# Patient Record
Sex: Male | Born: 1965 | Race: White | Hispanic: No | Marital: Married | Smoking: Never smoker
Health system: Southern US, Community
[De-identification: ages and names within clinical notes are randomized; demographics above are authoritative.]

---

## 2000-01-21 ENCOUNTER — Emergency Department (HOSPITAL_COMMUNITY): Admission: EM | Admit: 2000-01-21 | Discharge: 2000-01-21 | Payer: Self-pay | Admitting: Emergency Medicine

## 2012-05-11 ENCOUNTER — Ambulatory Visit: Payer: Self-pay | Admitting: Sports Medicine

## 2015-11-21 DIAGNOSIS — R21 Rash and other nonspecific skin eruption: Secondary | ICD-10-CM | POA: Diagnosis not present

## 2016-04-10 DIAGNOSIS — R140 Abdominal distension (gaseous): Secondary | ICD-10-CM | POA: Diagnosis not present

## 2016-04-10 DIAGNOSIS — K581 Irritable bowel syndrome with constipation: Secondary | ICD-10-CM | POA: Diagnosis not present

## 2016-04-10 DIAGNOSIS — E738 Other lactose intolerance: Secondary | ICD-10-CM | POA: Diagnosis not present

## 2016-04-10 DIAGNOSIS — R1032 Left lower quadrant pain: Secondary | ICD-10-CM | POA: Diagnosis not present

## 2016-05-02 DIAGNOSIS — F4322 Adjustment disorder with anxiety: Secondary | ICD-10-CM | POA: Diagnosis not present

## 2016-05-15 DIAGNOSIS — F4322 Adjustment disorder with anxiety: Secondary | ICD-10-CM | POA: Diagnosis not present

## 2016-05-27 DIAGNOSIS — M25511 Pain in right shoulder: Secondary | ICD-10-CM | POA: Diagnosis not present

## 2016-05-30 DIAGNOSIS — F4322 Adjustment disorder with anxiety: Secondary | ICD-10-CM | POA: Diagnosis not present

## 2016-07-21 DIAGNOSIS — F4322 Adjustment disorder with anxiety: Secondary | ICD-10-CM | POA: Diagnosis not present

## 2016-07-25 ENCOUNTER — Encounter: Payer: Self-pay | Admitting: Family Medicine

## 2016-07-25 ENCOUNTER — Ambulatory Visit (INDEPENDENT_AMBULATORY_CARE_PROVIDER_SITE_OTHER): Payer: BLUE CROSS/BLUE SHIELD | Admitting: Family Medicine

## 2016-07-25 VITALS — BP 110/80 | HR 91 | Temp 97.9°F | Ht 70.25 in | Wt 187.0 lb

## 2016-07-25 DIAGNOSIS — Z0000 Encounter for general adult medical examination without abnormal findings: Secondary | ICD-10-CM | POA: Diagnosis not present

## 2016-07-25 DIAGNOSIS — K582 Mixed irritable bowel syndrome: Secondary | ICD-10-CM

## 2016-07-25 DIAGNOSIS — Z23 Encounter for immunization: Secondary | ICD-10-CM

## 2016-07-25 LAB — CBC WITH DIFFERENTIAL/PLATELET
BASOS ABS: 0 10*3/uL (ref 0.0–0.1)
Basophils Relative: 0.5 % (ref 0.0–3.0)
Eosinophils Absolute: 0.3 10*3/uL (ref 0.0–0.7)
Eosinophils Relative: 5.4 % — ABNORMAL HIGH (ref 0.0–5.0)
HEMATOCRIT: 42.2 % (ref 39.0–52.0)
HEMOGLOBIN: 14.6 g/dL (ref 13.0–17.0)
LYMPHS PCT: 29.4 % (ref 12.0–46.0)
Lymphs Abs: 1.8 10*3/uL (ref 0.7–4.0)
MCHC: 34.7 g/dL (ref 30.0–36.0)
MCV: 86.7 fl (ref 78.0–100.0)
Monocytes Absolute: 0.8 10*3/uL (ref 0.1–1.0)
Monocytes Relative: 12.9 % — ABNORMAL HIGH (ref 3.0–12.0)
Neutro Abs: 3.2 10*3/uL (ref 1.4–7.7)
Neutrophils Relative %: 51.8 % (ref 43.0–77.0)
Platelets: 278 10*3/uL (ref 150.0–400.0)
RBC: 4.86 Mil/uL (ref 4.22–5.81)
RDW: 12.3 % (ref 11.5–15.5)
WBC: 6.2 10*3/uL (ref 4.0–10.5)

## 2016-07-25 LAB — HEPATIC FUNCTION PANEL
ALT: 48 U/L (ref 0–53)
AST: 46 U/L — AB (ref 0–37)
Albumin: 4.3 g/dL (ref 3.5–5.2)
Alkaline Phosphatase: 53 U/L (ref 39–117)
BILIRUBIN DIRECT: 0.1 mg/dL (ref 0.0–0.3)
Total Bilirubin: 0.5 mg/dL (ref 0.2–1.2)
Total Protein: 7 g/dL (ref 6.0–8.3)

## 2016-07-25 LAB — LIPID PANEL
CHOL/HDL RATIO: 3
Cholesterol: 205 mg/dL — ABNORMAL HIGH (ref 0–200)
HDL: 76.4 mg/dL (ref >39.00)
LDL CALC: 113 mg/dL — AB (ref 0–99)
NonHDL: 128.98
Triglycerides: 81 mg/dL (ref 0.0–149.0)
VLDL: 16.2 mg/dL (ref 0.0–40.0)

## 2016-07-25 LAB — BASIC METABOLIC PANEL
BUN: 20 mg/dL (ref 6–23)
CHLORIDE: 101 meq/L (ref 96–112)
CO2: 31 meq/L (ref 19–32)
CREATININE: 1.06 mg/dL (ref 0.40–1.50)
Calcium: 9.7 mg/dL (ref 8.4–10.5)
GFR: 79.01 mL/min (ref >60.00)
Glucose, Bld: 81 mg/dL (ref 70–99)
Potassium: 4.6 mEq/L (ref 3.5–5.1)
Sodium: 138 mEq/L (ref 135–145)

## 2016-07-25 LAB — TSH: TSH: 1.27 u[IU]/mL (ref 0.35–4.50)

## 2016-07-25 NOTE — Progress Notes (Signed)
Subjective:     Patient ID: Justin Rivera, male   DOB: 1966-02-01, 51 y.o.   MRN: [id number]  HPI Patient seen to establish care. Generally very healthy. He has not seen her regular physician in years. He works as a Education officer, community. He exercises fairly regularly. He takes no medications. He has question of IBS and has been followed by GI in the past. He states he had colonoscopy 2016 which was normal. He had problems with alternating constipation diarrhea and symptoms seem to be improved with peppermint oil.   He relates over the past several weeks having some intermittent episodes of anxiety type symptoms which tend to come on fairly abruptly and usually in the afternoon. These do not seem to occur in any one setting. He does not feel particularly stressed. He states that he frequently has a "flushed" sensation. He's never had any diaphoresis, headache, chest pain, dyspnea, palpitations, or any focal weakness. No recent appetite or weight changes.  No history of panic disorder. Symptoms sometimes last an hour but can last for several hours. No regular alcohol use. Nonsmoker.  He was taking DHEA supplement until a few weeks ago and he stopped that couple weeks ago and over the past 10 days he has not had any episodes of symptoms above  History reviewed. No pertinent past medical history. History reviewed. No pertinent surgical history.  reports that he has never smoked. He uses smokeless tobacco. He reports that he drinks alcohol. He reports that he does not use drugs. family history includes Heart attack in his maternal grandmother; Hypertension in his father. No Known Allergies   Review of Systems  Constitutional: Negative for activity change, appetite change, fatigue and fever.  HENT: Negative for congestion, ear pain and trouble swallowing.   Eyes: Negative for pain and visual disturbance.  Respiratory: Negative for cough, shortness of breath and wheezing.   Cardiovascular: Negative for chest  pain and palpitations.  Gastrointestinal: Negative for abdominal distention, abdominal pain, blood in stool, constipation, diarrhea, nausea, rectal pain and vomiting.  Endocrine: Negative for polydipsia and polyuria.  Genitourinary: Negative for dysuria, hematuria and testicular pain.  Musculoskeletal: Negative for arthralgias and joint swelling.  Skin: Negative for rash.  Neurological: Negative for dizziness, tremors, seizures, syncope, weakness and headaches.  Hematological: Negative for adenopathy.  Psychiatric/Behavioral: Negative for confusion and dysphoric mood.       Objective:   Physical Exam  Constitutional: He is oriented to person, place, and time. He appears well-developed and well-nourished. No distress.  HENT:  Head: Normocephalic and atraumatic.  Right Ear: External ear normal.  Left Ear: External ear normal.  Mouth/Throat: Oropharynx is clear and moist.  Eyes: Conjunctivae and EOM are normal. Pupils are equal, round, and reactive to light.  Neck: Normal range of motion. Neck supple. No thyromegaly present.  Cardiovascular: Normal rate, regular rhythm and normal heart sounds.   No murmur heard. Pulmonary/Chest: No respiratory distress. He has no wheezes. He has no rales.  Abdominal: Soft. Bowel sounds are normal. He exhibits no distension and no mass. There is no tenderness. There is no rebound and no guarding.  Musculoskeletal: He exhibits no edema.  Lymphadenopathy:    He has no cervical adenopathy.  Neurological: He is alert and oriented to person, place, and time. He displays normal reflexes. No cranial nerve deficit.  Skin: No rash noted.  Psychiatric: He has a normal mood and affect.       Assessment:     Physical exam. Patient has questionable history  of IBS as above relatively stable and improved with peppermint oil. He is describing recent episodes of what sounds anxiety type symptoms which are usually fairly transient. No history of panic disorder.     Plan:     -Check labs with CBC, TSH, basic metabolic panel, lipid panel, hepatic panel -Tetanus booster and flu vaccine given -Avoid supplements such as DHEA.  Justin CoveyBruce W Maeci Kalbfleisch MD [facility] at [facility]

## 2016-07-25 NOTE — Progress Notes (Signed)
Pre visit review using our clinic review tool, if applicable. No additional management support is needed unless otherwise documented below in the visit note. 

## 2016-07-27 DIAGNOSIS — K589 Irritable bowel syndrome without diarrhea: Secondary | ICD-10-CM | POA: Insufficient documentation

## 2016-08-01 ENCOUNTER — Telehealth: Payer: Self-pay | Admitting: Family Medicine

## 2016-08-01 ENCOUNTER — Other Ambulatory Visit: Payer: Self-pay

## 2016-08-01 DIAGNOSIS — R748 Abnormal levels of other serum enzymes: Secondary | ICD-10-CM

## 2016-08-01 NOTE — Telephone Encounter (Signed)
Pt is aware of results. 

## 2016-08-01 NOTE — Telephone Encounter (Signed)
Pt is return autumn call °

## 2016-08-07 DIAGNOSIS — F4322 Adjustment disorder with anxiety: Secondary | ICD-10-CM | POA: Diagnosis not present

## 2016-08-28 DIAGNOSIS — R140 Abdominal distension (gaseous): Secondary | ICD-10-CM | POA: Diagnosis not present

## 2016-08-28 DIAGNOSIS — K581 Irritable bowel syndrome with constipation: Secondary | ICD-10-CM | POA: Diagnosis not present

## 2016-09-05 DIAGNOSIS — F4322 Adjustment disorder with anxiety: Secondary | ICD-10-CM | POA: Diagnosis not present

## 2016-09-25 DIAGNOSIS — F4322 Adjustment disorder with anxiety: Secondary | ICD-10-CM | POA: Diagnosis not present

## 2016-10-03 ENCOUNTER — Other Ambulatory Visit (INDEPENDENT_AMBULATORY_CARE_PROVIDER_SITE_OTHER): Payer: BLUE CROSS/BLUE SHIELD

## 2016-10-03 DIAGNOSIS — R748 Abnormal levels of other serum enzymes: Secondary | ICD-10-CM

## 2016-10-03 LAB — HEPATIC FUNCTION PANEL
ALBUMIN: 4.2 g/dL (ref 3.5–5.2)
ALK PHOS: 57 U/L (ref 39–117)
ALT: 26 U/L (ref 0–53)
AST: 32 U/L (ref 0–37)
Bilirubin, Direct: 0.1 mg/dL (ref 0.0–0.3)
TOTAL PROTEIN: 6.9 g/dL (ref 6.0–8.3)
Total Bilirubin: 0.5 mg/dL (ref 0.2–1.2)

## 2016-10-08 DIAGNOSIS — R0982 Postnasal drip: Secondary | ICD-10-CM | POA: Diagnosis not present

## 2016-10-08 DIAGNOSIS — R05 Cough: Secondary | ICD-10-CM | POA: Diagnosis not present

## 2016-10-08 DIAGNOSIS — B9789 Other viral agents as the cause of diseases classified elsewhere: Secondary | ICD-10-CM | POA: Diagnosis not present

## 2016-10-08 DIAGNOSIS — J988 Other specified respiratory disorders: Secondary | ICD-10-CM | POA: Diagnosis not present

## 2016-11-07 ENCOUNTER — Ambulatory Visit (INDEPENDENT_AMBULATORY_CARE_PROVIDER_SITE_OTHER): Payer: BLUE CROSS/BLUE SHIELD | Admitting: Family Medicine

## 2016-11-07 ENCOUNTER — Encounter: Payer: Self-pay | Admitting: Family Medicine

## 2016-11-07 VITALS — BP 120/70 | HR 64 | Temp 98.5°F | Wt 192.6 lb

## 2016-11-07 DIAGNOSIS — F4322 Adjustment disorder with anxiety: Secondary | ICD-10-CM | POA: Diagnosis not present

## 2016-11-07 DIAGNOSIS — R053 Chronic cough: Secondary | ICD-10-CM

## 2016-11-07 DIAGNOSIS — R05 Cough: Secondary | ICD-10-CM

## 2016-11-07 NOTE — Progress Notes (Signed)
Subjective:     Patient ID: Justin Rivera, male   DOB: [birth year], 51 y.o.   MRN: [id number]  HPI Patient seen with 3 month history of intermittent cough. Never smoked. Cough has been very intermittent and nonproductive for the most part. Denies any appetite changes, weight changes, night sweats, fever, or dyspnea. Not aware of any postnasal drip symptoms. Occasional GERD symptoms. He has noted that cough seems to be worse in cold weather. No pleuritic pain.  4 weeks ago he went to another clinic and was told they thought he had viral type process. He was prescribed some type of cough suppressant but never took it.  He has used smokeless tobacco and is actually in the middle of a course of Chantix. He has noted interestingly that his anxiety symptoms seem to be much improved with taking Chantix and also his irritable bowel symptoms also seem to be improved. He has questions about whether he may be a candidate to take Wellbutrin when he finishes the Chantix.  No depression symptoms.  No past medical history on file. No past surgical history on file.  reports that he has never smoked. He uses smokeless tobacco. He reports that he drinks alcohol. He reports that he does not use drugs. family history includes Heart attack in his maternal grandmother; Hypertension in his father. No Known Allergies   Review of Systems  Constitutional: Negative for appetite change, chills, fever and unexpected weight change.  HENT: Negative for congestion, postnasal drip, sinus pressure and sore throat.   Respiratory: Positive for cough. Negative for shortness of breath and wheezing.   Cardiovascular: Negative for chest pain, palpitations and leg swelling.       Objective:   Physical Exam  Constitutional: He appears well-developed and well-nourished.  HENT:  Right Ear: External ear normal.  Left Ear: External ear normal.  Mouth/Throat: Oropharynx is clear and moist. No oropharyngeal exudate.  Neck: Neck  supple.  Cardiovascular: Normal rate and regular rhythm.   Pulmonary/Chest: Effort normal and breath sounds normal. No respiratory distress. He has no wheezes. He has no rales.  Lymphadenopathy:    He has no cervical adenopathy.       Assessment:     #1 several week history of intermittent cough. Nonfocal exam. No evidence for reactive airway changes. No postnasal drip. Question silent GERD related  #2 history of smokeless tobacco use currently on Chantix    Plan:     -Recommend trial of over-the-counter PPI with Nexium, Prevacid, or prilosec -Consider over-the-counter chlorpheniramine at night though he denies any current postnasal drip symptoms. -Chest x-ray if cough not improving over the next few weeks  Kristian Covey MD [facility] at [facility]

## 2016-11-07 NOTE — Patient Instructions (Signed)
Try OTC Prevacid, Nexium, or Prilosec.   Let me know if cough not improving over the next few weeks.

## 2016-11-07 NOTE — Progress Notes (Signed)
Pre visit review using our clinic review tool, if applicable. No additional management support is needed unless otherwise documented below in the visit note. 

## 2016-11-19 DIAGNOSIS — F4322 Adjustment disorder with anxiety: Secondary | ICD-10-CM | POA: Diagnosis not present

## 2016-12-12 DIAGNOSIS — F4322 Adjustment disorder with anxiety: Secondary | ICD-10-CM | POA: Diagnosis not present

## 2017-02-13 DIAGNOSIS — F4322 Adjustment disorder with anxiety: Secondary | ICD-10-CM | POA: Diagnosis not present

## 2017-03-06 DIAGNOSIS — F4322 Adjustment disorder with anxiety: Secondary | ICD-10-CM | POA: Diagnosis not present

## 2017-03-27 DIAGNOSIS — F4322 Adjustment disorder with anxiety: Secondary | ICD-10-CM | POA: Diagnosis not present

## 2017-04-10 DIAGNOSIS — M25551 Pain in right hip: Secondary | ICD-10-CM | POA: Diagnosis not present

## 2017-04-10 DIAGNOSIS — M25552 Pain in left hip: Secondary | ICD-10-CM | POA: Diagnosis not present

## 2017-05-01 ENCOUNTER — Ambulatory Visit (INDEPENDENT_AMBULATORY_CARE_PROVIDER_SITE_OTHER): Payer: BLUE CROSS/BLUE SHIELD | Admitting: Family Medicine

## 2017-05-01 ENCOUNTER — Encounter: Payer: Self-pay | Admitting: Family Medicine

## 2017-05-01 VITALS — BP 110/80 | HR 100 | Temp 98.3°F | Wt 189.3 lb

## 2017-05-01 DIAGNOSIS — Z23 Encounter for immunization: Secondary | ICD-10-CM

## 2017-05-01 DIAGNOSIS — F4322 Adjustment disorder with anxiety: Secondary | ICD-10-CM | POA: Diagnosis not present

## 2017-05-01 DIAGNOSIS — F419 Anxiety disorder, unspecified: Secondary | ICD-10-CM

## 2017-05-01 DIAGNOSIS — R0789 Other chest pain: Secondary | ICD-10-CM

## 2017-05-01 MED ORDER — SERTRALINE HCL 50 MG PO TABS: 50.0000 mg | ORAL_TABLET | Freq: Every day | ORAL | 3 refills | Status: DC

## 2017-05-01 NOTE — Progress Notes (Signed)
Subjective:     Patient ID: Justin Rivera, male   DOB: [birth year], 51 y.o.   MRN: [id number]  HPI Patient here to discuss anxiety issues. He states for the past several months he's had about every 3-4 weeks episodes of anxiety which usually last 3-4 days. These are fairly intense and last throughout the day. He's not had any issues with insomnia and generally sleeps well at night. Fairly pervasive anxiety symptoms but denies any history of depression.   Sometimes has brief atypical chest symptoms of pain- but never exertional.  He has used oral nicotine and had self prescribed Chantix which seemed to reduce his anxiety symptoms somewhat. Denies regular alcohol use. Stable caffeine use. No depression symptoms Pt is concerned whether there may be some sort of "hormonal" imbalance given the episodic nature.   Patient is concerned because brother died couple years ago suddenly age 49. There was no autopsy done.  They were unsure of cause of death. They had considered whether this could've been acute MI.   Is not aware of any family history of first degree relative with aneurysm.  Pt is able to exercise fairly vigorously without chest pain but is concerned because of brother's sudden death as above.    No past medical history on file. No past surgical history on file.  reports that he has never smoked. He uses smokeless tobacco. He reports that he drinks alcohol. He reports that he does not use drugs. family history includes Heart attack in his maternal grandmother; Hypertension in his father. No Known Allergies   Review of Systems  Constitutional: Negative for appetite change, chills, fever and unexpected weight change.  Respiratory: Negative for cough and shortness of breath.   Cardiovascular: Negative for leg swelling.       Objective:   Physical Exam  Constitutional: He is oriented to person, place, and time. He appears well-developed and well-nourished.  HENT:  Right Ear: External ear  normal.  Left Ear: External ear normal.  Mouth/Throat: Oropharynx is clear and moist.  Eyes: Pupils are equal, round, and reactive to light.  Neck: Neck supple. No thyromegaly present.  Cardiovascular: Normal rate and regular rhythm.   Pulmonary/Chest: Effort normal and breath sounds normal. No respiratory distress. He has no wheezes. He has no rales.  Musculoskeletal: He exhibits no edema.  Neurological: He is alert and oriented to person, place, and time.       Assessment:     #1 intermittent anxiety symptoms which usually last 3-4 days duration. These do not sound typical for panic disorder, generalized anxiety, or other specific anxiety.  #2 positive family history of sudden death in first-degree relative-brother    Plan:     -We recommended trial sertraline 50 mg once daily and feedback in 3 weeks.  Reviewed potential side effects.   -we discussed possible further testing to delineate cardiac risk of sudden death.  Discussed pros and cons of various tests- regular treadmill testing, stress echo, nuclear stress test, CT morphology study and he is interested in the latter.   -pt inquiring about testing "hormones" during episode.  Will check TSH, cortisol, DHEA, and testosterone during flare.  Kristian CoveyBruce W Samiah Ricklefs MD [facility]

## 2017-05-22 DIAGNOSIS — F4322 Adjustment disorder with anxiety: Secondary | ICD-10-CM | POA: Diagnosis not present

## 2017-06-12 ENCOUNTER — Ambulatory Visit: Payer: BLUE CROSS/BLUE SHIELD | Admitting: Family Medicine

## 2017-06-12 ENCOUNTER — Encounter: Payer: Self-pay | Admitting: Family Medicine

## 2017-06-12 VITALS — BP 120/80 | HR 65 | Temp 98.1°F | Wt 192.7 lb

## 2017-06-12 DIAGNOSIS — F419 Anxiety disorder, unspecified: Secondary | ICD-10-CM

## 2017-06-12 DIAGNOSIS — R229 Localized swelling, mass and lump, unspecified: Secondary | ICD-10-CM | POA: Diagnosis not present

## 2017-06-12 NOTE — Progress Notes (Signed)
Subjective:     Patient ID: Justin Rivera, male   DOB: 1966/04/02, 51 y.o.   MRN: [id number]  HPI Patient seen for follow-up of recent anxiety issues. Refer to recent notes. We started sertraline 50 mg daily. He has tolerated well no side effects. Anxiety symptoms seem to be suppressed. He recently reduced this himself to 25 mg but then went back up to 50 mg.  He relates subcutaneous thickening right lateral hip region. He noticed this incidentally recently. Nonpainful. He thinks this has grown some over the past couple of months .  Recently had gotten a massage and massage therapist had noticed this area. He has not noted any inguinal adenopathy. No night sweats. No fevers or chills. No appetite or weight changes.  No past medical history on file. No past surgical history on file.  reports that  has never smoked. He uses smokeless tobacco. He reports that he drinks alcohol. He reports that he does not use drugs. family history includes Heart attack in his maternal grandmother; Hypertension in his father. No Known Allergies   Review of Systems  Constitutional: Negative for appetite change, chills, fever and unexpected weight change.  Musculoskeletal: Negative for back pain.  Skin: Negative for rash.  Hematological: Negative for adenopathy. Does not bruise/bleed easily.  Psychiatric/Behavioral: Negative for dysphoric mood.       Objective:   Physical Exam  Constitutional: He appears well-developed and well-nourished.  Cardiovascular: Normal rate and regular rhythm.  Pulmonary/Chest: Effort normal and breath sounds normal. No respiratory distress. He has no wheezes. He has no rales.  Skin:  Patient has somewhat rounded fairly well demarcated mobile subcutaneous ?cystic type mass right lateral hip region. Nontender. No overlying skin changes.       Assessment:     #1 anxiety improved on sertraline  #2 right lateral hip subcutaneous swelling/mass. Benign features with increased  mobility and fairly well demarcated.    Plan:     -Continue sertraline 50 mg once daily -We discussed further evaluation of right lateral hip subcutaneous mass with either imaging or referral possibly to general surgery for further evaluation and he prefers the latter. We explained that imaging may still be equivocal  Kristian CoveyBruce W Daurice Ovando MD [facility]

## 2017-06-18 DIAGNOSIS — F4322 Adjustment disorder with anxiety: Secondary | ICD-10-CM | POA: Diagnosis not present

## 2017-07-02 DIAGNOSIS — F4322 Adjustment disorder with anxiety: Secondary | ICD-10-CM | POA: Diagnosis not present

## 2017-07-16 ENCOUNTER — Ambulatory Visit: Payer: Self-pay | Admitting: Surgery

## 2017-07-16 DIAGNOSIS — L723 Sebaceous cyst: Secondary | ICD-10-CM | POA: Diagnosis not present

## 2017-07-24 DIAGNOSIS — F4322 Adjustment disorder with anxiety: Secondary | ICD-10-CM | POA: Diagnosis not present

## 2017-08-06 DIAGNOSIS — F4322 Adjustment disorder with anxiety: Secondary | ICD-10-CM | POA: Diagnosis not present

## 2017-08-28 DIAGNOSIS — F4322 Adjustment disorder with anxiety: Secondary | ICD-10-CM | POA: Diagnosis not present

## 2017-10-02 DIAGNOSIS — F4322 Adjustment disorder with anxiety: Secondary | ICD-10-CM | POA: Diagnosis not present

## 2017-11-13 DIAGNOSIS — F4322 Adjustment disorder with anxiety: Secondary | ICD-10-CM | POA: Diagnosis not present

## 2017-11-26 DIAGNOSIS — F4322 Adjustment disorder with anxiety: Secondary | ICD-10-CM | POA: Diagnosis not present

## 2017-12-04 DIAGNOSIS — L218 Other seborrheic dermatitis: Secondary | ICD-10-CM | POA: Diagnosis not present

## 2017-12-04 DIAGNOSIS — L718 Other rosacea: Secondary | ICD-10-CM | POA: Diagnosis not present

## 2017-12-18 ENCOUNTER — Other Ambulatory Visit: Payer: Self-pay | Admitting: Family Medicine

## 2017-12-25 DIAGNOSIS — F4322 Adjustment disorder with anxiety: Secondary | ICD-10-CM | POA: Diagnosis not present

## 2017-12-29 ENCOUNTER — Ambulatory Visit (INDEPENDENT_AMBULATORY_CARE_PROVIDER_SITE_OTHER): Payer: BLUE CROSS/BLUE SHIELD | Admitting: Family Medicine

## 2017-12-29 VITALS — BP 122/80 | HR 64 | Temp 97.7°F | Wt 193.7 lb

## 2017-12-29 DIAGNOSIS — K582 Mixed irritable bowel syndrome: Secondary | ICD-10-CM | POA: Diagnosis not present

## 2017-12-29 DIAGNOSIS — F419 Anxiety disorder, unspecified: Secondary | ICD-10-CM | POA: Diagnosis not present

## 2017-12-29 DIAGNOSIS — R0789 Other chest pain: Secondary | ICD-10-CM | POA: Diagnosis not present

## 2017-12-29 NOTE — Progress Notes (Signed)
  Subjective:     Patient ID: Justin Rivera, male   DOB: September 10, 1965, 52 y.o.   MRN: [id number]  HPI Patient seen discuss following issues  Long history of GI issues. He's been diagnosed with IBS per GI in the past. He has episodes of abdominal bloating associated with nausea without vomiting along with intermittent constipation and diarrhea. He states that he tends to "cycle". Frequently has couple weeks of symptom free interval alternationg with about a week of increased symptoms. He's tried multiple things including probiotics and Xifaxan without improvement.  He's had previous colonoscopy about 4 years ago per GI. He's taken  TUMS without relief. Peppermint oil provided some relief.  His tried elimination of dairy and gluten's without improvement.  He thinks GI symptoms trigger increased anxiety. He has what he describes as "panic "attack. When he feels very bloated he has increased anxiety and occasional associated chest pain. Has never had any exertional chest pain. He was tried on Zoloft but at 50 mg daily did not feel like this helped his anxiety.  Patient exercises regularly and has never had chest pain with exertion. He did have a brother that died of sudden cardiac death around age 50. We had discussed cardiac CT calcium scoring about a year ago and referral was made but for some reason he does not recall being notified and  never went  No past medical history on file. No past surgical history on file.  reports that he has never smoked. He uses smokeless tobacco. He reports that he drinks alcohol. He reports that he does not use drugs. family history includes Heart attack in his maternal grandmother; Hypertension in his father. No Known Allergies   Review of Systems  Constitutional: Negative for fatigue.  Eyes: Negative for visual disturbance.  Respiratory: Negative for cough and chest tightness.   Cardiovascular: Negative for palpitations and leg swelling.  Gastrointestinal:  Positive for abdominal distention, constipation, diarrhea and nausea. Negative for blood in stool and vomiting.  Neurological: Negative for dizziness, syncope, weakness, light-headedness and headaches.       Objective:   Physical Exam  Constitutional: He appears well-developed and well-nourished.  Cardiovascular: Normal rate and regular rhythm.  No murmur heard. Pulmonary/Chest: Effort normal and breath sounds normal. No respiratory distress. He has no wheezes. He has no rales.  Musculoskeletal: He exhibits no edema.       Assessment:     #1 frequent GI complaints with bloating and nausea without vomiting. Reported history of IBS  #2 anxiety symptoms which he correlates consistently with GI symptoms  #3 intermittent atypical chest pain. Never exertional related. Positive family history of premature CAD in a brother    Plan:     -We recommend he get back into see GI and he will set this up -We discussed other potential options for treatment of anxiety. Recommended against benzodiazepines. We discussed trial of another SSRI at this point he wishes to wait and observe -Set up CT cardiac calcium scoring to help further risk stratify  Kristian CoveyBruce W Rushawn Capshaw MD [facility]

## 2017-12-29 NOTE — Patient Instructions (Signed)
We will set up Cardiac CT scoring.   I think it would be a good to set up to see Dr Loreta AveMann again.

## 2018-01-07 DIAGNOSIS — K581 Irritable bowel syndrome with constipation: Secondary | ICD-10-CM | POA: Diagnosis not present

## 2018-01-07 DIAGNOSIS — R140 Abdominal distension (gaseous): Secondary | ICD-10-CM | POA: Diagnosis not present

## 2018-01-13 ENCOUNTER — Other Ambulatory Visit: Payer: BLUE CROSS/BLUE SHIELD

## 2018-02-05 ENCOUNTER — Ambulatory Visit (INDEPENDENT_AMBULATORY_CARE_PROVIDER_SITE_OTHER)
Admission: RE | Admit: 2018-02-05 | Discharge: 2018-02-05 | Disposition: A | Discharge status: Home / Self Care | Payer: Self-pay | Source: Ambulatory Visit | Attending: Family Medicine | Admitting: Family Medicine

## 2018-02-05 ENCOUNTER — Ambulatory Visit: Payer: Self-pay | Admitting: *Deleted

## 2018-02-05 DIAGNOSIS — R0789 Other chest pain: Secondary | ICD-10-CM

## 2018-02-05 NOTE — Telephone Encounter (Signed)
'  [facility] report from CT Cardiac Scoring:  "RIGHT lower lobe pulmonary nodule. No follow-up needed if patient is low-risk. Non-contrast chest CT can be considered in 12 months if patient is high-risk."

## 2018-03-26 DIAGNOSIS — L218 Other seborrheic dermatitis: Secondary | ICD-10-CM | POA: Diagnosis not present

## 2018-03-26 DIAGNOSIS — L245 Irritant contact dermatitis due to other chemical products: Secondary | ICD-10-CM | POA: Diagnosis not present

## 2018-03-26 DIAGNOSIS — L718 Other rosacea: Secondary | ICD-10-CM | POA: Diagnosis not present

## 2018-05-07 DIAGNOSIS — L718 Other rosacea: Secondary | ICD-10-CM | POA: Diagnosis not present

## 2018-05-07 DIAGNOSIS — L218 Other seborrheic dermatitis: Secondary | ICD-10-CM | POA: Diagnosis not present

## 2018-08-12 DIAGNOSIS — F419 Anxiety disorder, unspecified: Secondary | ICD-10-CM | POA: Diagnosis not present

## 2018-09-07 DIAGNOSIS — R194 Change in bowel habit: Secondary | ICD-10-CM | POA: Diagnosis not present

## 2018-09-07 DIAGNOSIS — Z1211 Encounter for screening for malignant neoplasm of colon: Secondary | ICD-10-CM | POA: Diagnosis not present

## 2018-11-04 ENCOUNTER — Ambulatory Visit (INDEPENDENT_AMBULATORY_CARE_PROVIDER_SITE_OTHER): Payer: BLUE CROSS/BLUE SHIELD | Admitting: Family Medicine

## 2018-11-04 ENCOUNTER — Other Ambulatory Visit: Payer: Self-pay

## 2018-11-04 DIAGNOSIS — R05 Cough: Secondary | ICD-10-CM | POA: Diagnosis not present

## 2018-11-04 DIAGNOSIS — R059 Cough, unspecified: Secondary | ICD-10-CM

## 2018-11-04 NOTE — Progress Notes (Signed)
Patient ID: Justin Rivera, male   DOB: 1966/07/02, 53 y.o.   MRN: [id number]  Virtual Visit via Video Note  This visit type was conducted due to national recommendations for restrictions regarding the COVID-19 pandemic in an effort to limit this patient's exposure and mitigate transmission in our community.    I connected with Abdulkadir Mcaulay on 11/04/18 at  8:45 AM EDT by a video enabled telemedicine application and verified that I am speaking with the correct person using two identifiers.  Location patient: home Location provider:work or home office Persons participating in the virtual visit: patient, provider  I discussed the limitations of evaluation and management by telemedicine and the availability of in person appointments. The patient expressed understanding and agreed to proceed.   HPI: Patient has had about 2 months of dry cough.  He states back in February around the middle of the month he went to a continuing education conference in [place], [place].  He developed fever for 2 to 3 days and had some increased cough.  His fever resolved after a few days.  He generally feels well now with no appetite or weight changes.  Now has just some dry cough which is relatively mild.  No chills.  No dyspnea.  Exercising without difficulty.  Never smoked.  Denies any GERD symptoms.  No postnasal drip symptoms.  No wheezing.  His main concern is that he works as an Transport planner and would like to confirm his COVID-19 status prior to going back.    He is not concerned about infectious risk at this time but mostly his immune status before going back into frontline's medical therapy.   ROS: See pertinent positives and negatives per HPI.  No past medical history on file.  No past surgical history on file.  Family History  Problem Relation Age of Onset  . Hypertension Father   . Heart attack Maternal Grandmother     SOCIAL HX: Non-smoker.   Current Outpatient Medications:  .  sertraline  (ZOLOFT) 50 MG tablet, TAKE 1 TABLET BY MOUTH EVERY DAY (Patient not taking: Reported on 12/29/2017), Disp: 90 tablet, Rfl: 0  EXAM:  VITALS per patient if applicable:  GENERAL: alert, oriented, appears well and in no acute distress  HEENT: atraumatic, conjunttiva clear, no obvious abnormalities on inspection of external nose and ears  NECK: normal movements of the head and neck  LUNGS: on inspection no signs of respiratory distress, breathing rate appears normal, no obvious gross SOB, gasping or wheezing  CV: no obvious cyanosis  MS: moves all visible extremities without noticeable abnormality  PSYCH/NEURO: pleasant and cooperative, no obvious depression or anxiety, speech and thought processing grossly intact  ASSESSMENT AND PLAN:  Discussed the following assessment and plan:  Cough.  Suspect related to recent acute viral process.  Patient did have some initial fever.  Even though this was back in February he has some concerns about previous COVID-19 infection.  He specifically would like to confirm if he had infection.  He knows he is not likely infective at this point as onset was almost 2 months ago but he would like to know his status before going back onto the front lines of healthcare.  This seems reasonable.  -We ordered COVID 19 IgG. -We discussed etiologies of persistent cough including silent GERD, postnasal drip, reactive airway, medication related such as ACE inhibitor.  He does not have any red flags such as appetite or weight change, fever, dyspnea.  No ACE use.  No GERD  or PND symptoms. -If cough not fully resolving in the next couple weeks consider chest x-ray and further evaluation     I discussed the assessment and treatment plan with the patient. The patient was provided an opportunity to ask questions and all were answered. The patient agreed with the plan and demonstrated an understanding of the instructions.   The patient was advised to call back or seek an  in-person evaluation if the symptoms worsen or if the condition fails to improve as anticipated.   Evelena PeatBruce Genoa Freyre, MD

## 2018-11-05 LAB — SAR COV2 SEROLOGY (COVID19)AB(IGG),IA: SARS CoV2 AB IGG: NEGATIVE

## 2018-11-10 ENCOUNTER — Other Ambulatory Visit: Payer: Self-pay | Admitting: Family Medicine

## 2018-11-19 DIAGNOSIS — M25562 Pain in left knee: Secondary | ICD-10-CM | POA: Diagnosis not present

## 2018-11-26 DIAGNOSIS — M25562 Pain in left knee: Secondary | ICD-10-CM | POA: Diagnosis not present

## 2018-12-03 DIAGNOSIS — M25562 Pain in left knee: Secondary | ICD-10-CM | POA: Diagnosis not present

## 2018-12-03 DIAGNOSIS — M25561 Pain in right knee: Secondary | ICD-10-CM | POA: Diagnosis not present

## 2019-02-04 DIAGNOSIS — M19041 Primary osteoarthritis, right hand: Secondary | ICD-10-CM | POA: Diagnosis not present

## 2019-02-04 DIAGNOSIS — M79641 Pain in right hand: Secondary | ICD-10-CM | POA: Diagnosis not present

## 2019-04-22 DIAGNOSIS — M25512 Pain in left shoulder: Secondary | ICD-10-CM | POA: Diagnosis not present

## 2019-04-22 DIAGNOSIS — M79644 Pain in right finger(s): Secondary | ICD-10-CM | POA: Diagnosis not present

## 2019-04-22 DIAGNOSIS — M25511 Pain in right shoulder: Secondary | ICD-10-CM | POA: Diagnosis not present

## 2019-04-29 DIAGNOSIS — M25511 Pain in right shoulder: Secondary | ICD-10-CM | POA: Diagnosis not present

## 2019-05-03 DIAGNOSIS — Z20828 Contact with and (suspected) exposure to other viral communicable diseases: Secondary | ICD-10-CM | POA: Diagnosis not present

## 2019-06-12 DIAGNOSIS — Z20828 Contact with and (suspected) exposure to other viral communicable diseases: Secondary | ICD-10-CM | POA: Diagnosis not present

## 2019-06-28 DIAGNOSIS — M25561 Pain in right knee: Secondary | ICD-10-CM | POA: Diagnosis not present

## 2019-07-20 DIAGNOSIS — L0291 Cutaneous abscess, unspecified: Secondary | ICD-10-CM | POA: Diagnosis not present

## 2019-08-10 DIAGNOSIS — M9905 Segmental and somatic dysfunction of pelvic region: Secondary | ICD-10-CM | POA: Diagnosis not present

## 2019-08-10 DIAGNOSIS — M9903 Segmental and somatic dysfunction of lumbar region: Secondary | ICD-10-CM | POA: Diagnosis not present

## 2019-08-10 DIAGNOSIS — M9901 Segmental and somatic dysfunction of cervical region: Secondary | ICD-10-CM | POA: Diagnosis not present

## 2019-08-10 DIAGNOSIS — M9902 Segmental and somatic dysfunction of thoracic region: Secondary | ICD-10-CM | POA: Diagnosis not present

## 2019-08-16 DIAGNOSIS — M9903 Segmental and somatic dysfunction of lumbar region: Secondary | ICD-10-CM | POA: Diagnosis not present

## 2019-08-16 DIAGNOSIS — M9905 Segmental and somatic dysfunction of pelvic region: Secondary | ICD-10-CM | POA: Diagnosis not present

## 2019-08-16 DIAGNOSIS — M9901 Segmental and somatic dysfunction of cervical region: Secondary | ICD-10-CM | POA: Diagnosis not present

## 2019-08-16 DIAGNOSIS — M9902 Segmental and somatic dysfunction of thoracic region: Secondary | ICD-10-CM | POA: Diagnosis not present

## 2019-08-18 DIAGNOSIS — M9901 Segmental and somatic dysfunction of cervical region: Secondary | ICD-10-CM | POA: Diagnosis not present

## 2019-08-18 DIAGNOSIS — M9903 Segmental and somatic dysfunction of lumbar region: Secondary | ICD-10-CM | POA: Diagnosis not present

## 2019-08-18 DIAGNOSIS — M9905 Segmental and somatic dysfunction of pelvic region: Secondary | ICD-10-CM | POA: Diagnosis not present

## 2019-08-18 DIAGNOSIS — M9902 Segmental and somatic dysfunction of thoracic region: Secondary | ICD-10-CM | POA: Diagnosis not present

## 2019-10-27 DIAGNOSIS — M25562 Pain in left knee: Secondary | ICD-10-CM | POA: Diagnosis not present

## 2019-11-01 DIAGNOSIS — M9901 Segmental and somatic dysfunction of cervical region: Secondary | ICD-10-CM | POA: Diagnosis not present

## 2019-11-01 DIAGNOSIS — M9905 Segmental and somatic dysfunction of pelvic region: Secondary | ICD-10-CM | POA: Diagnosis not present

## 2019-11-01 DIAGNOSIS — M9902 Segmental and somatic dysfunction of thoracic region: Secondary | ICD-10-CM | POA: Diagnosis not present

## 2019-11-01 DIAGNOSIS — M9903 Segmental and somatic dysfunction of lumbar region: Secondary | ICD-10-CM | POA: Diagnosis not present

## 2019-11-14 ENCOUNTER — Telehealth (INDEPENDENT_AMBULATORY_CARE_PROVIDER_SITE_OTHER): Payer: BC Managed Care – PPO | Admitting: Family Medicine

## 2019-11-14 DIAGNOSIS — F410 Panic disorder [episodic paroxysmal anxiety] without agoraphobia: Secondary | ICD-10-CM | POA: Diagnosis not present

## 2019-11-14 NOTE — Progress Notes (Signed)
Patient ID: Justin Rivera, male   DOB: 09/07/1965, 54 y.o.   MRN: [id number]   This visit type was conducted due to national recommendations for restrictions regarding the COVID-19 pandemic in an effort to limit this patient's exposure and mitigate transmission in our community.   Virtual Visit via Video Note  I connected with Justin Rivera on 11/14/19 at  1:30 PM EDT by a video enabled telemedicine application and verified that I am speaking with the correct person using two identifiers.  Location patient: home Location provider:work or home office Persons participating in the virtual visit: patient, provider  I discussed the limitations of evaluation and management by telemedicine and the availability of in person appointments. The patient expressed understanding and agreed to proceed.   HPI: Justin Rivera has history of anxiety symptoms and we discussed these a few times in the past.  He was tried briefly on sertraline but he only took this about 30 days and cannot recall why stopped.  He does not recall any side effects.  He states either his anxiety symptoms had abated or the sertraline was not working.  He cannot recall which.  In any event, yesterday had severe anxiety symptoms.  He had some chest symptoms and shortness of breath.  He had never occurred with exertion.  He feels very anxious during episodes.  He still has some sertraline and actually started this back yesterday at 50 mg.  His wife had alprazolam a took a half of a 0.5 mg which seemed to help his anxiety symptoms  Denies any new or specific stressors.  Exercises regularly has never had any chest discomfort with exercise   ROS: See pertinent positives and negatives per HPI.  No past medical history on file.  No past surgical history on file.  Family History  Problem Relation Age of Onset  . Hypertension Father   . Heart attack Maternal Grandmother     SOCIAL HX: Married.  Works as an Associate Professor.  Building services engineer.   Current  Outpatient Medications:  .  sertraline (ZOLOFT) 50 MG tablet, TAKE 1 TABLET BY MOUTH EVERY DAY, Disp: 90 tablet, Rfl: 0  EXAM:  VITALS per patient if applicable:  GENERAL: alert, oriented, appears well and in no acute distress  HEENT: atraumatic, conjunttiva clear, no obvious abnormalities on inspection of external nose and ears  NECK: normal movements of the head and neck  LUNGS: on inspection no signs of respiratory distress, breathing rate appears normal, no obvious gross SOB, gasping or wheezing  CV: no obvious cyanosis  MS: moves all visible extremities without noticeable abnormality  PSYCH/NEURO: pleasant and cooperative, no obvious depression or anxiety, speech and thought processing grossly intact  ASSESSMENT AND PLAN:  Discussed the following assessment and plan:  Episodic anxiety.  Question panic disorder  -We discussed starting back sertraline at 25 mg daily for 1 week then titrate to 50 mg.  May further titrate up by 25 mg weekly up to 100 mg if needed for ongoing anxiety symptoms.  We will touch base again in 3 weeks to reassess     I discussed the assessment and treatment plan with the patient. The patient was provided an opportunity to ask questions and all were answered. The patient agreed with the plan and demonstrated an understanding of the instructions.   The patient was advised to call back or seek an in-person evaluation if the symptoms worsen or if the condition fails to improve as anticipated.     Evelena Peat, MD

## 2019-11-22 DIAGNOSIS — M9903 Segmental and somatic dysfunction of lumbar region: Secondary | ICD-10-CM | POA: Diagnosis not present

## 2019-11-22 DIAGNOSIS — M9901 Segmental and somatic dysfunction of cervical region: Secondary | ICD-10-CM | POA: Diagnosis not present

## 2019-11-22 DIAGNOSIS — M9902 Segmental and somatic dysfunction of thoracic region: Secondary | ICD-10-CM | POA: Diagnosis not present

## 2019-11-22 DIAGNOSIS — M9905 Segmental and somatic dysfunction of pelvic region: Secondary | ICD-10-CM | POA: Diagnosis not present

## 2020-01-19 ENCOUNTER — Telehealth: Payer: Self-pay | Admitting: Family Medicine

## 2020-01-19 MED ORDER — SERTRALINE HCL 50 MG PO TABS: 50.0000 mg | ORAL_TABLET | Freq: Every day | ORAL | 0 refills | Status: DC

## 2020-01-19 NOTE — Telephone Encounter (Signed)
Refill sent in

## 2020-01-19 NOTE — Telephone Encounter (Signed)
Pt stated the medication works very well. CPE set for 7/23 at 7:00 am.  Medication Refill:  Zoloft   Pharmacy:  [id number] - [place], [place] - [address] Phone:  [phone]  Fax:  [phone]

## 2020-02-03 ENCOUNTER — Ambulatory Visit (INDEPENDENT_AMBULATORY_CARE_PROVIDER_SITE_OTHER): Payer: BC Managed Care – PPO | Admitting: Family Medicine

## 2020-02-03 ENCOUNTER — Other Ambulatory Visit: Payer: Self-pay

## 2020-02-03 ENCOUNTER — Encounter: Payer: Self-pay | Admitting: Family Medicine

## 2020-02-03 VITALS — BP 120/70 | HR 75 | Temp 97.9°F | Ht 70.25 in | Wt 200.3 lb

## 2020-02-03 DIAGNOSIS — Z0000 Encounter for general adult medical examination without abnormal findings: Secondary | ICD-10-CM | POA: Diagnosis not present

## 2020-02-03 NOTE — Progress Notes (Signed)
Established Patient Office Visit  Subjective:  Patient ID: Justin Rivera, male    DOB: April 10, 1966  Age: 54 y.o. MRN: [id number]  CC:  Chief Complaint  Patient presents with  . Annual Exam    HPI Justin Rivera presents for physical exam.  Generally very healthy.  He has history of some chronic anxiety which has been stable on sertraline.  Takes no other medications.  He states he had colonoscopy sometime around 2018 and was told to get follow-up in a couple years.  He is in process of scheduling.  He is not aware of any prior history of polyps.  Questionable history of irritable bowel syndrome.  Other health maintenance reviewed  -No history of shingles vaccine -No history of hepatitis C screening and low risk -Tetanus up-to-date -Covid vaccine already given    History reviewed. No pertinent past medical history.  History reviewed. No pertinent surgical history.  Family History  Problem Relation Age of Onset  . Hypertension Father   . Heart attack Maternal Grandmother     Social History   Socioeconomic History  . Marital status: Married    Spouse name: Not on file  . Number of children: Not on file  . Years of education: Not on file  . Highest education level: Not on file  Occupational History  . Not on file  Tobacco Use  . Smoking status: Never Smoker  . Smokeless tobacco: Current User  Substance and Sexual Activity  . Alcohol use: Yes  . Drug use: No  . Sexual activity: Not on file  Other Topics Concern  . Not on file  Social History Narrative  . Not on file   Social Determinants of Health   Financial Resource Strain:   . Difficulty of Paying Living Expenses:   Food Insecurity:   . Worried About Programme researcher, broadcasting/film/video in the Last Year:   . Barista in the Last Year:   Transportation Needs:   . Freight forwarder (Medical):   Marland Kitchen Lack of Transportation (Non-Medical):   Physical Activity:   . Days of Exercise per Week:   . Minutes of  Exercise per Session:   Stress:   . Feeling of Stress :   Social Connections:   . Frequency of Communication with Friends and Family:   . Frequency of Social Gatherings with Friends and Family:   . Attends Religious Services:   . Active Member of Clubs or Organizations:   . Attends Banker Meetings:   Marland Kitchen Marital Status:   Intimate Partner Violence:   . Fear of Current or Ex-Partner:   . Emotionally Abused:   Marland Kitchen Physically Abused:   . Sexually Abused:     Outpatient Medications Prior to Visit  Medication Sig Dispense Refill  . sertraline (ZOLOFT) 50 MG tablet Take 1 tablet (50 mg total) by mouth daily. 90 tablet 0   No facility-administered medications prior to visit.    No Known Allergies  ROS Review of Systems  Constitutional: Negative for activity change, appetite change, fatigue and fever.  HENT: Negative for congestion, ear pain and trouble swallowing.   Eyes: Negative for pain and visual disturbance.  Respiratory: Negative for cough, shortness of breath and wheezing.   Cardiovascular: Negative for chest pain and palpitations.  Gastrointestinal: Negative for abdominal distention, abdominal pain, blood in stool, constipation, diarrhea, nausea, rectal pain and vomiting.  Genitourinary: Negative for dysuria, hematuria and testicular pain.  Musculoskeletal: Negative for arthralgias and joint swelling.  Skin: Negative for rash.  Neurological: Negative for dizziness, syncope and headaches.  Hematological: Negative for adenopathy.  Psychiatric/Behavioral: Negative for confusion and dysphoric mood.      Objective:    Physical Exam Constitutional:      General: He is not in acute distress.    Appearance: He is well-developed.  HENT:     Head: Normocephalic and atraumatic.     Right Ear: External ear normal.     Left Ear: External ear normal.  Eyes:     Conjunctiva/sclera: Conjunctivae normal.     Pupils: Pupils are equal, round, and reactive to light.    Neck:     Thyroid: No thyromegaly.  Cardiovascular:     Rate and Rhythm: Normal rate and regular rhythm.     Heart sounds: Normal heart sounds. No murmur heard.   Pulmonary:     Effort: No respiratory distress.     Breath sounds: No wheezing or rales.  Abdominal:     General: Bowel sounds are normal. There is no distension.     Palpations: Abdomen is soft. There is no mass.     Tenderness: There is no abdominal tenderness. There is no guarding or rebound.  Musculoskeletal:     Cervical back: Normal range of motion and neck supple.  Lymphadenopathy:     Cervical: No cervical adenopathy.  Skin:    Findings: No rash.  Neurological:     Mental Status: He is alert and oriented to person, place, and time.     Cranial Nerves: No cranial nerve deficit.     Deep Tendon Reflexes: Reflexes normal.     BP 120/70   Pulse 75   Temp 97.9 F (36.6 C) (Oral)   Ht 5' 10.25" (1.784 m)   Wt 200 lb 5 oz (90.9 kg)   SpO2 96%   BMI 28.54 kg/m  Wt Readings from Last 3 Encounters:  02/03/20 200 lb 5 oz (90.9 kg)  12/29/17 193 lb 11.2 oz (87.9 kg)  06/12/17 192 lb 11.2 oz (87.4 kg)     Health Maintenance Due  Topic Date Due  . Hepatitis C Screening  Never done  . HIV Screening  Never done  . COLONOSCOPY  Never done    There are no preventive care reminders to display for this patient.  Lab Results  Component Value Date   TSH 1.27 07/25/2016   Lab Results  Component Value Date   WBC 6.2 07/25/2016   HGB 14.6 07/25/2016   HCT 42.2 07/25/2016   MCV 86.7 07/25/2016   PLT 278.0 07/25/2016   Lab Results  Component Value Date   NA 138 07/25/2016   K 4.6 07/25/2016   CO2 31 07/25/2016   GLUCOSE 81 07/25/2016   BUN 20 07/25/2016   CREATININE 1.06 07/25/2016   BILITOT 0.5 10/03/2016   ALKPHOS 57 10/03/2016   AST 32 10/03/2016   ALT 26 10/03/2016   PROT 6.9 10/03/2016   ALBUMIN 4.2 10/03/2016   CALCIUM 9.7 07/25/2016   GFR 79.01 07/25/2016   Lab Results  Component Value  Date   CHOL 205 (H) 07/25/2016   Lab Results  Component Value Date   HDL 76.40 07/25/2016   Lab Results  Component Value Date   LDLCALC 113 (H) 07/25/2016   Lab Results  Component Value Date   TRIG 81.0 07/25/2016   Lab Results  Component Value Date   CHOLHDL 3 07/25/2016   No results found for: HGBA1C    Assessment &  Plan:   Problem List Items Addressed This Visit    None    Visit Diagnoses    Physical exam    -  Primary   Relevant Orders   Basic metabolic panel   Lipid panel   CBC with Differential/Platelet   TSH   Hepatic function panel   PSA   Hep C Antibody    Generally healthy 54 year old male.  He is in process of setting up repeat colonoscopy.  We recommend checking labs today including PSA and hepatitis C antibody.  We discussed Shingrix vaccine and he will consider at some point later this year but declines today  No orders of the defined types were placed in this encounter.   Follow-up: No follow-ups on file.    Evelena Peat, MD

## 2020-02-03 NOTE — Patient Instructions (Signed)
Preventive Care 81-52 Years Old, Male Preventive care refers to lifestyle choices and visits with your health care provider that can promote health and wellness. This includes:  A yearly physical exam. This is also called an annual well check.  Regular dental and eye exams.  Immunizations.  Screening for certain conditions.  Healthy lifestyle choices, such as eating a healthy diet, getting regular exercise, not using drugs or products that contain nicotine and tobacco, and limiting alcohol use. What can I expect for my preventive care visit? Physical exam Your health care provider will check:  Height and weight. These may be used to calculate body mass index (BMI), which is a measurement that tells if you are at a healthy weight.  Heart rate and blood pressure.  Your skin for abnormal spots. Counseling Your health care provider may ask you questions about:  Alcohol, tobacco, and drug use.  Emotional well-being.  Home and relationship well-being.  Sexual activity.  Eating habits.  Work and work Statistician. What immunizations do I need?  Influenza (flu) vaccine  This is recommended every year. Tetanus, diphtheria, and pertussis (Tdap) vaccine  You may need a Td booster every 10 years. Varicella (chickenpox) vaccine  You may need this vaccine if you have not already been vaccinated. Zoster (shingles) vaccine  You may need this after age 53. Measles, mumps, and rubella (MMR) vaccine  You may need at least one dose of MMR if you were born in 1957 or later. You may also need a second dose. Pneumococcal conjugate (PCV13) vaccine  You may need this if you have certain conditions and were not previously vaccinated. Pneumococcal polysaccharide (PPSV23) vaccine  You may need one or two doses if you smoke cigarettes or if you have certain conditions. Meningococcal conjugate (MenACWY) vaccine  You may need this if you have certain conditions. Hepatitis A  vaccine  You may need this if you have certain conditions or if you travel or work in places where you may be exposed to hepatitis A. Hepatitis B vaccine  You may need this if you have certain conditions or if you travel or work in places where you may be exposed to hepatitis B. Haemophilus influenzae type b (Hib) vaccine  You may need this if you have certain risk factors. Human papillomavirus (HPV) vaccine  If recommended by your health care provider, you may need three doses over 6 months. You may receive vaccines as individual doses or as more than one vaccine together in one shot (combination vaccines). Talk with your health care provider about the risks and benefits of combination vaccines. What tests do I need? Blood tests  Lipid and cholesterol levels. These may be checked every 5 years, or more frequently if you are over 46 years old.  Hepatitis C test.  Hepatitis B test. Screening  Lung cancer screening. You may have this screening every year starting at age 38 if you have a 30-pack-year history of smoking and currently smoke or have quit within the past 15 years.  Prostate cancer screening. Recommendations will vary depending on your family history and other risks.  Colorectal cancer screening. All adults should have this screening starting at age 77 and continuing until age 61. Your health care provider may recommend screening at age 33 if you are at increased risk. You will have tests every 1-10 years, depending on your results and the type of screening test.  Diabetes screening. This is done by checking your blood sugar (glucose) after you have not eaten  for a while (fasting). You may have this done every 1-3 years.  Sexually transmitted disease (STD) testing. Follow these instructions at home: Eating and drinking  Eat a diet that includes fresh fruits and vegetables, whole grains, lean protein, and low-fat dairy products.  Take vitamin and mineral supplements as  recommended by your health care provider.  Do not drink alcohol if your health care provider tells you not to drink.  If you drink alcohol: ? Limit how much you have to 0-2 drinks a day. ? Be aware of how much alcohol is in your drink. In the [place] one drink equals one 12 oz bottle of beer (355 mL), one 5 oz glass of wine (148 mL), or one 1 oz glass of hard liquor (44 mL). Lifestyle  Take daily care of your teeth and gums.  Stay active. Exercise for at least 30 minutes on 5 or more days each week.  Do not use any products that contain nicotine or tobacco, such as cigarettes, e-cigarettes, and chewing tobacco. If you need help quitting, ask your health care provider.  If you are sexually active, practice safe sex. Use a condom or other form of protection to prevent STIs (sexually transmitted infections).  Talk with your health care provider about taking a low-dose aspirin every day starting at age 53. What's next?  Go to your health care provider once a year for a well check visit.  Ask your health care provider how often you should have your eyes and teeth checked.  Stay up to date on all vaccines. This information is not intended to replace advice given to you by your health care provider. Make sure you discuss any questions you have with your health care provider. Document Revised: 06/24/2018 Document Reviewed: 06/24/2018 Elsevier Patient Education  2020 [employer].

## 2020-02-06 LAB — CBC WITH DIFFERENTIAL/PLATELET
Absolute Monocytes: 673 cells/uL (ref 200–950)
Basophils Absolute: 21 cells/uL (ref 0–200)
Basophils Relative: 0.4 %
Eosinophils Absolute: 244 cells/uL (ref 15–500)
Eosinophils Relative: 4.6 %
HCT: 49.5 % (ref 38.5–50.0)
Hemoglobin: 16 g/dL (ref 13.2–17.1)
Lymphs Abs: 1627 cells/uL (ref 850–3900)
MCH: 29.9 pg (ref 27.0–33.0)
MCHC: 32.3 g/dL (ref 32.0–36.0)
MCV: 92.4 fL (ref 80.0–100.0)
MPV: 10.5 fL (ref 7.5–12.5)
Monocytes Relative: 12.7 %
Neutro Abs: 2735 cells/uL (ref 1500–7800)
Neutrophils Relative %: 51.6 %
Platelets: 270 10*3/uL (ref 140–400)
RBC: 5.36 10*6/uL (ref 4.20–5.80)
RDW: 12.3 % (ref 11.0–15.0)
Total Lymphocyte: 30.7 %
WBC: 5.3 10*3/uL (ref 3.8–10.8)

## 2020-02-06 LAB — BASIC METABOLIC PANEL
BUN: 16 mg/dL (ref 7–25)
CO2: 27 mmol/L (ref 20–32)
Calcium: 9.2 mg/dL (ref 8.6–10.3)
Chloride: 103 mmol/L (ref 98–110)
Creat: 0.99 mg/dL (ref 0.70–1.33)
Glucose, Bld: 81 mg/dL (ref 65–99)
Potassium: 4.7 mmol/L (ref 3.5–5.3)
Sodium: 138 mmol/L (ref 135–146)

## 2020-02-06 LAB — HEPATIC FUNCTION PANEL
AG Ratio: 1.7 (calc) (ref 1.0–2.5)
ALT: 36 U/L (ref 9–46)
AST: 38 U/L — ABNORMAL HIGH (ref 10–35)
Albumin: 4.1 g/dL (ref 3.6–5.1)
Alkaline phosphatase (APISO): 57 U/L (ref 35–144)
Bilirubin, Direct: 0.1 mg/dL (ref 0.0–0.2)
Globulin: 2.4 g/dL (calc) (ref 1.9–3.7)
Indirect Bilirubin: 0.3 mg/dL (calc) (ref 0.2–1.2)
Total Bilirubin: 0.4 mg/dL (ref 0.2–1.2)
Total Protein: 6.5 g/dL (ref 6.1–8.1)

## 2020-02-06 LAB — HEPATITIS C ANTIBODY
Hepatitis C Ab: NONREACTIVE
SIGNAL TO CUT-OFF: 0.01 (ref <1.00)

## 2020-02-06 LAB — LIPID PANEL
Cholesterol: 157 mg/dL (ref <200)
HDL: 63 mg/dL (ref >=40)
LDL Cholesterol (Calc): 80 mg/dL (calc)
Non-HDL Cholesterol (Calc): 94 mg/dL (calc) (ref <130)
Total CHOL/HDL Ratio: 2.5 (calc) (ref <5.0)
Triglycerides: 61 mg/dL (ref <150)

## 2020-02-06 LAB — PSA: PSA: 0.9 ng/mL (ref <=4.0)

## 2020-02-06 LAB — TSH: TSH: 1.78 mIU/L (ref 0.40–4.50)

## 2020-02-09 DIAGNOSIS — Z1211 Encounter for screening for malignant neoplasm of colon: Secondary | ICD-10-CM | POA: Diagnosis not present

## 2020-03-02 DIAGNOSIS — Z1211 Encounter for screening for malignant neoplasm of colon: Secondary | ICD-10-CM | POA: Diagnosis not present

## 2020-04-11 ENCOUNTER — Other Ambulatory Visit: Payer: Self-pay | Admitting: Family Medicine

## 2020-08-01 ENCOUNTER — Other Ambulatory Visit: Payer: Self-pay

## 2020-08-01 MED ORDER — SERTRALINE HCL 50 MG PO TABS: 50.0000 mg | ORAL_TABLET | Freq: Every day | ORAL | 1 refills | Status: DC

## 2020-09-17 ENCOUNTER — Other Ambulatory Visit: Payer: Self-pay | Admitting: Family Medicine

## 2020-11-29 DIAGNOSIS — E291 Testicular hypofunction: Secondary | ICD-10-CM | POA: Diagnosis not present

## 2020-12-07 DIAGNOSIS — E291 Testicular hypofunction: Secondary | ICD-10-CM | POA: Diagnosis not present

## 2020-12-07 DIAGNOSIS — R768 Other specified abnormal immunological findings in serum: Secondary | ICD-10-CM | POA: Diagnosis not present

## 2020-12-07 DIAGNOSIS — F419 Anxiety disorder, unspecified: Secondary | ICD-10-CM | POA: Diagnosis not present

## 2020-12-07 DIAGNOSIS — R6882 Decreased libido: Secondary | ICD-10-CM | POA: Diagnosis not present

## 2020-12-14 DIAGNOSIS — E291 Testicular hypofunction: Secondary | ICD-10-CM | POA: Diagnosis not present

## 2021-01-07 ENCOUNTER — Other Ambulatory Visit: Payer: Self-pay | Admitting: Family Medicine

## 2021-01-18 ENCOUNTER — Other Ambulatory Visit: Payer: Self-pay | Admitting: Orthopaedic Surgery

## 2021-01-18 DIAGNOSIS — E291 Testicular hypofunction: Secondary | ICD-10-CM | POA: Diagnosis not present

## 2021-01-18 DIAGNOSIS — R5382 Chronic fatigue, unspecified: Secondary | ICD-10-CM | POA: Diagnosis not present

## 2021-01-18 DIAGNOSIS — M25521 Pain in right elbow: Secondary | ICD-10-CM

## 2021-01-24 DIAGNOSIS — R454 Irritability and anger: Secondary | ICD-10-CM | POA: Diagnosis not present

## 2021-01-24 DIAGNOSIS — R5382 Chronic fatigue, unspecified: Secondary | ICD-10-CM | POA: Diagnosis not present

## 2021-01-24 DIAGNOSIS — E291 Testicular hypofunction: Secondary | ICD-10-CM | POA: Diagnosis not present

## 2021-01-24 DIAGNOSIS — M2550 Pain in unspecified joint: Secondary | ICD-10-CM | POA: Diagnosis not present

## 2021-02-21 DIAGNOSIS — M25561 Pain in right knee: Secondary | ICD-10-CM | POA: Diagnosis not present

## 2021-02-22 ENCOUNTER — Other Ambulatory Visit: Payer: BC Managed Care – PPO

## 2021-03-19 DIAGNOSIS — M1711 Unilateral primary osteoarthritis, right knee: Secondary | ICD-10-CM | POA: Diagnosis not present

## 2021-04-05 ENCOUNTER — Other Ambulatory Visit: Payer: Self-pay | Admitting: Family Medicine

## 2021-04-22 DIAGNOSIS — M79642 Pain in left hand: Secondary | ICD-10-CM | POA: Diagnosis not present

## 2021-04-22 DIAGNOSIS — M19041 Primary osteoarthritis, right hand: Secondary | ICD-10-CM | POA: Diagnosis not present

## 2021-04-22 DIAGNOSIS — M19042 Primary osteoarthritis, left hand: Secondary | ICD-10-CM | POA: Diagnosis not present

## 2021-04-22 DIAGNOSIS — M79641 Pain in right hand: Secondary | ICD-10-CM | POA: Diagnosis not present

## 2021-05-16 DIAGNOSIS — D485 Neoplasm of uncertain behavior of skin: Secondary | ICD-10-CM | POA: Diagnosis not present

## 2021-05-16 DIAGNOSIS — D2272 Melanocytic nevi of left lower limb, including hip: Secondary | ICD-10-CM | POA: Diagnosis not present

## 2021-05-16 DIAGNOSIS — D1801 Hemangioma of skin and subcutaneous tissue: Secondary | ICD-10-CM | POA: Diagnosis not present

## 2021-05-16 DIAGNOSIS — D225 Melanocytic nevi of trunk: Secondary | ICD-10-CM | POA: Diagnosis not present

## 2021-05-16 DIAGNOSIS — L814 Other melanin hyperpigmentation: Secondary | ICD-10-CM | POA: Diagnosis not present

## 2021-05-16 DIAGNOSIS — L821 Other seborrheic keratosis: Secondary | ICD-10-CM | POA: Diagnosis not present

## 2021-05-21 DIAGNOSIS — M531 Cervicobrachial syndrome: Secondary | ICD-10-CM | POA: Diagnosis not present

## 2021-05-21 DIAGNOSIS — M5386 Other specified dorsopathies, lumbar region: Secondary | ICD-10-CM | POA: Diagnosis not present

## 2021-05-21 DIAGNOSIS — M9903 Segmental and somatic dysfunction of lumbar region: Secondary | ICD-10-CM | POA: Diagnosis not present

## 2021-05-21 DIAGNOSIS — M9904 Segmental and somatic dysfunction of sacral region: Secondary | ICD-10-CM | POA: Diagnosis not present

## 2021-05-28 DIAGNOSIS — M9903 Segmental and somatic dysfunction of lumbar region: Secondary | ICD-10-CM | POA: Diagnosis not present

## 2021-05-28 DIAGNOSIS — M9904 Segmental and somatic dysfunction of sacral region: Secondary | ICD-10-CM | POA: Diagnosis not present

## 2021-05-28 DIAGNOSIS — M531 Cervicobrachial syndrome: Secondary | ICD-10-CM | POA: Diagnosis not present

## 2021-05-28 DIAGNOSIS — M5386 Other specified dorsopathies, lumbar region: Secondary | ICD-10-CM | POA: Diagnosis not present

## 2021-06-03 DIAGNOSIS — M9903 Segmental and somatic dysfunction of lumbar region: Secondary | ICD-10-CM | POA: Diagnosis not present

## 2021-06-03 DIAGNOSIS — M531 Cervicobrachial syndrome: Secondary | ICD-10-CM | POA: Diagnosis not present

## 2021-06-03 DIAGNOSIS — M5386 Other specified dorsopathies, lumbar region: Secondary | ICD-10-CM | POA: Diagnosis not present

## 2021-06-03 DIAGNOSIS — M9904 Segmental and somatic dysfunction of sacral region: Secondary | ICD-10-CM | POA: Diagnosis not present

## 2021-06-11 DIAGNOSIS — M9904 Segmental and somatic dysfunction of sacral region: Secondary | ICD-10-CM | POA: Diagnosis not present

## 2021-06-11 DIAGNOSIS — M9903 Segmental and somatic dysfunction of lumbar region: Secondary | ICD-10-CM | POA: Diagnosis not present

## 2021-06-11 DIAGNOSIS — M5386 Other specified dorsopathies, lumbar region: Secondary | ICD-10-CM | POA: Diagnosis not present

## 2021-06-11 DIAGNOSIS — M531 Cervicobrachial syndrome: Secondary | ICD-10-CM | POA: Diagnosis not present

## 2021-06-14 DIAGNOSIS — D485 Neoplasm of uncertain behavior of skin: Secondary | ICD-10-CM | POA: Diagnosis not present

## 2021-06-14 DIAGNOSIS — L988 Other specified disorders of the skin and subcutaneous tissue: Secondary | ICD-10-CM | POA: Diagnosis not present

## 2021-06-18 DIAGNOSIS — M5386 Other specified dorsopathies, lumbar region: Secondary | ICD-10-CM | POA: Diagnosis not present

## 2021-06-18 DIAGNOSIS — M9903 Segmental and somatic dysfunction of lumbar region: Secondary | ICD-10-CM | POA: Diagnosis not present

## 2021-06-18 DIAGNOSIS — M531 Cervicobrachial syndrome: Secondary | ICD-10-CM | POA: Diagnosis not present

## 2021-06-18 DIAGNOSIS — M9904 Segmental and somatic dysfunction of sacral region: Secondary | ICD-10-CM | POA: Diagnosis not present

## 2021-06-25 DIAGNOSIS — M9903 Segmental and somatic dysfunction of lumbar region: Secondary | ICD-10-CM | POA: Diagnosis not present

## 2021-06-25 DIAGNOSIS — M9904 Segmental and somatic dysfunction of sacral region: Secondary | ICD-10-CM | POA: Diagnosis not present

## 2021-06-25 DIAGNOSIS — M531 Cervicobrachial syndrome: Secondary | ICD-10-CM | POA: Diagnosis not present

## 2021-06-25 DIAGNOSIS — M5386 Other specified dorsopathies, lumbar region: Secondary | ICD-10-CM | POA: Diagnosis not present

## 2021-07-02 DIAGNOSIS — M9904 Segmental and somatic dysfunction of sacral region: Secondary | ICD-10-CM | POA: Diagnosis not present

## 2021-07-02 DIAGNOSIS — M531 Cervicobrachial syndrome: Secondary | ICD-10-CM | POA: Diagnosis not present

## 2021-07-02 DIAGNOSIS — M9903 Segmental and somatic dysfunction of lumbar region: Secondary | ICD-10-CM | POA: Diagnosis not present

## 2021-07-02 DIAGNOSIS — M5386 Other specified dorsopathies, lumbar region: Secondary | ICD-10-CM | POA: Diagnosis not present

## 2021-07-09 DIAGNOSIS — M9903 Segmental and somatic dysfunction of lumbar region: Secondary | ICD-10-CM | POA: Diagnosis not present

## 2021-07-09 DIAGNOSIS — M5386 Other specified dorsopathies, lumbar region: Secondary | ICD-10-CM | POA: Diagnosis not present

## 2021-07-09 DIAGNOSIS — M9904 Segmental and somatic dysfunction of sacral region: Secondary | ICD-10-CM | POA: Diagnosis not present

## 2021-07-09 DIAGNOSIS — M531 Cervicobrachial syndrome: Secondary | ICD-10-CM | POA: Diagnosis not present

## 2021-07-16 DIAGNOSIS — M531 Cervicobrachial syndrome: Secondary | ICD-10-CM | POA: Diagnosis not present

## 2021-07-16 DIAGNOSIS — M9904 Segmental and somatic dysfunction of sacral region: Secondary | ICD-10-CM | POA: Diagnosis not present

## 2021-07-16 DIAGNOSIS — M5386 Other specified dorsopathies, lumbar region: Secondary | ICD-10-CM | POA: Diagnosis not present

## 2021-07-16 DIAGNOSIS — M9903 Segmental and somatic dysfunction of lumbar region: Secondary | ICD-10-CM | POA: Diagnosis not present

## 2021-07-19 ENCOUNTER — Telehealth: Payer: Self-pay | Admitting: Family Medicine

## 2021-07-19 NOTE — Telephone Encounter (Signed)
Patient calling in with respiratory symptoms: Shortness of breath, chest pain, palpitations or other red words send to Triage  Does the patient have a fever over 100, cough, congestion, sore throat, runny nose, lost of taste/smell (please list symptoms that patient has)?cough  What date did symptoms start?07-11-2021 (If over 5 days ago, pt may be scheduled for in person visit)  Have you tested for Covid in the last 5 days? No   If yes, was it positive []  OR negative [] ? If positive in the last 5 days, please schedule virtual visit now. If negative, schedule for an in person OV with the next available provider if PCP has no openings. Please also let patient know they will be tested again (follow the script below)  "you will have to arrive prior to your appt time to be Covid tested. Please park in back of office at the [facility] & call [phone] to let the staff know you have arrived. A staff member will meet you at your car to do a rapid covid test. Once the test has resulted you will be notified by phone of your results to determine if appt will remain an in person visit or be converted to a virtual/phone visit. If you arrive less than before your appt time, your visit will be automatically converted to virtual & any recommended testing will happen AFTER the visit." Pt has virtual with dr [phone] on 07-23-2021  THINGS TO REMEMBER  If no availability for virtual visit in office,  please schedule another [place] office  If no availability at another [place] office, please instruct patient that they can schedule an evisit or virtual visit through their mychart account. Visits up to 8pm  patients can be seen in office 5 days after positive COVID test

## 2021-07-23 ENCOUNTER — Telehealth: Payer: BC Managed Care – PPO | Admitting: Family Medicine

## 2021-07-23 ENCOUNTER — Other Ambulatory Visit: Payer: Self-pay

## 2021-07-23 DIAGNOSIS — M531 Cervicobrachial syndrome: Secondary | ICD-10-CM | POA: Diagnosis not present

## 2021-07-23 DIAGNOSIS — M5386 Other specified dorsopathies, lumbar region: Secondary | ICD-10-CM | POA: Diagnosis not present

## 2021-07-23 DIAGNOSIS — M9903 Segmental and somatic dysfunction of lumbar region: Secondary | ICD-10-CM | POA: Diagnosis not present

## 2021-07-23 DIAGNOSIS — M9904 Segmental and somatic dysfunction of sacral region: Secondary | ICD-10-CM | POA: Diagnosis not present

## 2021-07-24 NOTE — Progress Notes (Signed)
Patient was not seen in office

## 2021-07-30 DIAGNOSIS — M531 Cervicobrachial syndrome: Secondary | ICD-10-CM | POA: Diagnosis not present

## 2021-07-30 DIAGNOSIS — M5386 Other specified dorsopathies, lumbar region: Secondary | ICD-10-CM | POA: Diagnosis not present

## 2021-07-30 DIAGNOSIS — M9903 Segmental and somatic dysfunction of lumbar region: Secondary | ICD-10-CM | POA: Diagnosis not present

## 2021-07-30 DIAGNOSIS — M9904 Segmental and somatic dysfunction of sacral region: Secondary | ICD-10-CM | POA: Diagnosis not present

## 2021-08-06 DIAGNOSIS — M531 Cervicobrachial syndrome: Secondary | ICD-10-CM | POA: Diagnosis not present

## 2021-08-06 DIAGNOSIS — M9903 Segmental and somatic dysfunction of lumbar region: Secondary | ICD-10-CM | POA: Diagnosis not present

## 2021-08-06 DIAGNOSIS — M5386 Other specified dorsopathies, lumbar region: Secondary | ICD-10-CM | POA: Diagnosis not present

## 2021-08-06 DIAGNOSIS — M9904 Segmental and somatic dysfunction of sacral region: Secondary | ICD-10-CM | POA: Diagnosis not present

## 2021-08-13 DIAGNOSIS — M5386 Other specified dorsopathies, lumbar region: Secondary | ICD-10-CM | POA: Diagnosis not present

## 2021-08-13 DIAGNOSIS — M531 Cervicobrachial syndrome: Secondary | ICD-10-CM | POA: Diagnosis not present

## 2021-08-13 DIAGNOSIS — M9903 Segmental and somatic dysfunction of lumbar region: Secondary | ICD-10-CM | POA: Diagnosis not present

## 2021-08-13 DIAGNOSIS — M9904 Segmental and somatic dysfunction of sacral region: Secondary | ICD-10-CM | POA: Diagnosis not present

## 2021-08-20 DIAGNOSIS — M9904 Segmental and somatic dysfunction of sacral region: Secondary | ICD-10-CM | POA: Diagnosis not present

## 2021-08-20 DIAGNOSIS — M531 Cervicobrachial syndrome: Secondary | ICD-10-CM | POA: Diagnosis not present

## 2021-08-20 DIAGNOSIS — M5386 Other specified dorsopathies, lumbar region: Secondary | ICD-10-CM | POA: Diagnosis not present

## 2021-08-20 DIAGNOSIS — M9903 Segmental and somatic dysfunction of lumbar region: Secondary | ICD-10-CM | POA: Diagnosis not present

## 2021-08-27 DIAGNOSIS — M5386 Other specified dorsopathies, lumbar region: Secondary | ICD-10-CM | POA: Diagnosis not present

## 2021-08-27 DIAGNOSIS — M9904 Segmental and somatic dysfunction of sacral region: Secondary | ICD-10-CM | POA: Diagnosis not present

## 2021-08-27 DIAGNOSIS — M531 Cervicobrachial syndrome: Secondary | ICD-10-CM | POA: Diagnosis not present

## 2021-08-27 DIAGNOSIS — M9903 Segmental and somatic dysfunction of lumbar region: Secondary | ICD-10-CM | POA: Diagnosis not present

## 2021-09-10 DIAGNOSIS — M9904 Segmental and somatic dysfunction of sacral region: Secondary | ICD-10-CM | POA: Diagnosis not present

## 2021-09-10 DIAGNOSIS — M9903 Segmental and somatic dysfunction of lumbar region: Secondary | ICD-10-CM | POA: Diagnosis not present

## 2021-09-10 DIAGNOSIS — M5386 Other specified dorsopathies, lumbar region: Secondary | ICD-10-CM | POA: Diagnosis not present

## 2021-09-10 DIAGNOSIS — M531 Cervicobrachial syndrome: Secondary | ICD-10-CM | POA: Diagnosis not present

## 2021-09-25 DIAGNOSIS — M9904 Segmental and somatic dysfunction of sacral region: Secondary | ICD-10-CM | POA: Diagnosis not present

## 2021-09-25 DIAGNOSIS — M9903 Segmental and somatic dysfunction of lumbar region: Secondary | ICD-10-CM | POA: Diagnosis not present

## 2021-09-25 DIAGNOSIS — M5386 Other specified dorsopathies, lumbar region: Secondary | ICD-10-CM | POA: Diagnosis not present

## 2021-09-25 DIAGNOSIS — M531 Cervicobrachial syndrome: Secondary | ICD-10-CM | POA: Diagnosis not present

## 2021-10-07 DIAGNOSIS — M5386 Other specified dorsopathies, lumbar region: Secondary | ICD-10-CM | POA: Diagnosis not present

## 2021-10-07 DIAGNOSIS — M9904 Segmental and somatic dysfunction of sacral region: Secondary | ICD-10-CM | POA: Diagnosis not present

## 2021-10-07 DIAGNOSIS — M9903 Segmental and somatic dysfunction of lumbar region: Secondary | ICD-10-CM | POA: Diagnosis not present

## 2021-10-07 DIAGNOSIS — M531 Cervicobrachial syndrome: Secondary | ICD-10-CM | POA: Diagnosis not present

## 2021-10-25 ENCOUNTER — Other Ambulatory Visit: Payer: BC Managed Care – PPO

## 2021-10-30 DIAGNOSIS — M9903 Segmental and somatic dysfunction of lumbar region: Secondary | ICD-10-CM | POA: Diagnosis not present

## 2021-10-30 DIAGNOSIS — M5386 Other specified dorsopathies, lumbar region: Secondary | ICD-10-CM | POA: Diagnosis not present

## 2021-10-30 DIAGNOSIS — M9904 Segmental and somatic dysfunction of sacral region: Secondary | ICD-10-CM | POA: Diagnosis not present

## 2021-10-30 DIAGNOSIS — M531 Cervicobrachial syndrome: Secondary | ICD-10-CM | POA: Diagnosis not present

## 2021-11-14 DIAGNOSIS — M170 Bilateral primary osteoarthritis of knee: Secondary | ICD-10-CM | POA: Diagnosis not present

## 2021-11-15 ENCOUNTER — Ambulatory Visit
Admission: RE | Admit: 2021-11-15 | Discharge: 2021-11-15 | Disposition: A | Discharge status: Home / Self Care | Payer: BC Managed Care – PPO | Source: Ambulatory Visit | Attending: Orthopaedic Surgery | Admitting: Orthopaedic Surgery

## 2021-11-15 DIAGNOSIS — M25421 Effusion, right elbow: Secondary | ICD-10-CM | POA: Diagnosis not present

## 2021-11-15 DIAGNOSIS — M25521 Pain in right elbow: Secondary | ICD-10-CM

## 2021-11-15 DIAGNOSIS — M19021 Primary osteoarthritis, right elbow: Secondary | ICD-10-CM | POA: Diagnosis not present

## 2021-11-15 DIAGNOSIS — R937 Abnormal findings on diagnostic imaging of other parts of musculoskeletal system: Secondary | ICD-10-CM | POA: Diagnosis not present

## 2021-11-15 DIAGNOSIS — M24021 Loose body in right elbow: Secondary | ICD-10-CM | POA: Diagnosis not present

## 2021-11-18 DIAGNOSIS — M5386 Other specified dorsopathies, lumbar region: Secondary | ICD-10-CM | POA: Diagnosis not present

## 2021-11-18 DIAGNOSIS — M9904 Segmental and somatic dysfunction of sacral region: Secondary | ICD-10-CM | POA: Diagnosis not present

## 2021-11-18 DIAGNOSIS — M531 Cervicobrachial syndrome: Secondary | ICD-10-CM | POA: Diagnosis not present

## 2021-11-18 DIAGNOSIS — M9903 Segmental and somatic dysfunction of lumbar region: Secondary | ICD-10-CM | POA: Diagnosis not present

## 2021-11-21 DIAGNOSIS — M1711 Unilateral primary osteoarthritis, right knee: Secondary | ICD-10-CM | POA: Diagnosis not present

## 2022-01-06 DIAGNOSIS — M531 Cervicobrachial syndrome: Secondary | ICD-10-CM | POA: Diagnosis not present

## 2022-01-06 DIAGNOSIS — M9903 Segmental and somatic dysfunction of lumbar region: Secondary | ICD-10-CM | POA: Diagnosis not present

## 2022-01-06 DIAGNOSIS — M5386 Other specified dorsopathies, lumbar region: Secondary | ICD-10-CM | POA: Diagnosis not present

## 2022-01-06 DIAGNOSIS — M9904 Segmental and somatic dysfunction of sacral region: Secondary | ICD-10-CM | POA: Diagnosis not present

## 2022-01-20 DIAGNOSIS — M5386 Other specified dorsopathies, lumbar region: Secondary | ICD-10-CM | POA: Diagnosis not present

## 2022-01-20 DIAGNOSIS — M531 Cervicobrachial syndrome: Secondary | ICD-10-CM | POA: Diagnosis not present

## 2022-01-20 DIAGNOSIS — M9903 Segmental and somatic dysfunction of lumbar region: Secondary | ICD-10-CM | POA: Diagnosis not present

## 2022-01-20 DIAGNOSIS — M9904 Segmental and somatic dysfunction of sacral region: Secondary | ICD-10-CM | POA: Diagnosis not present

## 2022-02-03 DIAGNOSIS — M5386 Other specified dorsopathies, lumbar region: Secondary | ICD-10-CM | POA: Diagnosis not present

## 2022-02-03 DIAGNOSIS — M9903 Segmental and somatic dysfunction of lumbar region: Secondary | ICD-10-CM | POA: Diagnosis not present

## 2022-02-03 DIAGNOSIS — M531 Cervicobrachial syndrome: Secondary | ICD-10-CM | POA: Diagnosis not present

## 2022-02-03 DIAGNOSIS — M9904 Segmental and somatic dysfunction of sacral region: Secondary | ICD-10-CM | POA: Diagnosis not present

## 2022-02-14 ENCOUNTER — Encounter: Payer: Self-pay | Admitting: Family Medicine

## 2022-02-14 ENCOUNTER — Telehealth (INDEPENDENT_AMBULATORY_CARE_PROVIDER_SITE_OTHER): Payer: BC Managed Care – PPO | Admitting: Family Medicine

## 2022-02-14 VITALS — Ht 70.25 in | Wt 194.0 lb

## 2022-02-14 DIAGNOSIS — R6882 Decreased libido: Secondary | ICD-10-CM

## 2022-02-14 DIAGNOSIS — R5383 Other fatigue: Secondary | ICD-10-CM | POA: Diagnosis not present

## 2022-02-14 NOTE — Progress Notes (Signed)
Patient ID: Justin Rivera, male   DOB: 1966/06/13, 56 y.o.   MRN: [id number]   Virtual Visit via Video Note  I connected with  on 02/14/22 at  1:45 PM EDT by a video enabled telemedicine application and verified that I am speaking with the correct person using two identifiers.  Location patient: home Location provider:work or home office Persons participating in the virtual visit: patient, provider  I discussed the limitations of evaluation and management by telemedicine and the availability of in person appointments. The patient expressed understanding and agreed to proceed.   HPI: Justin Rivera called with some general malaise and decreased libido over the past several months especially.  He realizes that some of this may be related to general aging.  He still exercises regularly with running and resistance training.  His weight is actually down slightly compared with prior years.  He feels like there is been some redistribution of body fat with increased waist obesity compared with previous.  He specifically would like to get his testosterone checked.  Last labs were about 2 years ago.  History reviewed. No pertinent past medical history. History reviewed. No pertinent surgical history.  reports that he has never smoked. He uses smokeless tobacco. He reports current alcohol use. He reports that he does not use drugs. family history includes Heart attack in his maternal grandmother; Hypertension in his father. No Known Allergies    ROS: See pertinent positives and negatives per HPI.  History reviewed. No pertinent past medical history.  History reviewed. No pertinent surgical history.  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  EXAM:  VITALS per patient if applicable:  GENERAL: alert, oriented, appears well and in no  acute distress  HEENT: atraumatic, conjunttiva clear, no obvious abnormalities on inspection of external nose and ears  NECK: normal movements of the head and neck  LUNGS: on inspection no signs of respiratory distress, breathing rate appears normal, no obvious gross SOB, gasping or wheezing  CV: no obvious cyanosis  MS: moves all visible extremities without noticeable abnormality  PSYCH/NEURO: pleasant and cooperative, no obvious depression or anxiety, speech and thought processing grossly intact  ASSESSMENT AND PLAN:  Discussed the following assessment and plan:  Fatigue, unspecified type - Plan: TSH  Low libido - Plan: Testosterone  -Schedule future labs with TSH and total testosterone level. -He will schedule early morning labs for next week. -Continue regular exercise habits   I discussed the assessment and treatment plan with the patient. The patient was provided an opportunity to ask questions and all were answered. The patient agreed with the plan and demonstrated an understanding of the instructions.   The patient was advised to call back or seek an in-person evaluation if the symptoms worsen or if the condition fails to improve as anticipated.     Evelena Peat, MD

## 2022-02-17 DIAGNOSIS — M531 Cervicobrachial syndrome: Secondary | ICD-10-CM | POA: Diagnosis not present

## 2022-02-17 DIAGNOSIS — M9904 Segmental and somatic dysfunction of sacral region: Secondary | ICD-10-CM | POA: Diagnosis not present

## 2022-02-17 DIAGNOSIS — M9903 Segmental and somatic dysfunction of lumbar region: Secondary | ICD-10-CM | POA: Diagnosis not present

## 2022-02-17 DIAGNOSIS — M5386 Other specified dorsopathies, lumbar region: Secondary | ICD-10-CM | POA: Diagnosis not present

## 2022-02-21 ENCOUNTER — Other Ambulatory Visit (INDEPENDENT_AMBULATORY_CARE_PROVIDER_SITE_OTHER): Payer: BC Managed Care – PPO

## 2022-02-21 DIAGNOSIS — R6882 Decreased libido: Secondary | ICD-10-CM | POA: Diagnosis not present

## 2022-02-21 DIAGNOSIS — R5383 Other fatigue: Secondary | ICD-10-CM

## 2022-02-21 LAB — TESTOSTERONE: Testosterone: 400.36 ng/dL (ref 300.00–890.00)

## 2022-02-21 LAB — TSH: TSH: 1.39 u[IU]/mL (ref 0.35–5.50)

## 2022-02-28 DIAGNOSIS — M79641 Pain in right hand: Secondary | ICD-10-CM | POA: Diagnosis not present

## 2022-03-04 DIAGNOSIS — M9904 Segmental and somatic dysfunction of sacral region: Secondary | ICD-10-CM | POA: Diagnosis not present

## 2022-03-04 DIAGNOSIS — M9903 Segmental and somatic dysfunction of lumbar region: Secondary | ICD-10-CM | POA: Diagnosis not present

## 2022-03-04 DIAGNOSIS — M5386 Other specified dorsopathies, lumbar region: Secondary | ICD-10-CM | POA: Diagnosis not present

## 2022-03-04 DIAGNOSIS — M531 Cervicobrachial syndrome: Secondary | ICD-10-CM | POA: Diagnosis not present

## 2022-03-12 DIAGNOSIS — M24021 Loose body in right elbow: Secondary | ICD-10-CM | POA: Diagnosis not present

## 2022-03-12 DIAGNOSIS — M25721 Osteophyte, right elbow: Secondary | ICD-10-CM | POA: Diagnosis not present

## 2022-03-12 DIAGNOSIS — M19021 Primary osteoarthritis, right elbow: Secondary | ICD-10-CM | POA: Diagnosis not present

## 2022-03-12 DIAGNOSIS — M24521 Contracture, right elbow: Secondary | ICD-10-CM | POA: Diagnosis not present

## 2022-03-12 DIAGNOSIS — M65331 Trigger finger, right middle finger: Secondary | ICD-10-CM | POA: Diagnosis not present

## 2022-03-12 DIAGNOSIS — M25711 Osteophyte, right shoulder: Secondary | ICD-10-CM | POA: Diagnosis not present

## 2022-03-13 DIAGNOSIS — M19021 Primary osteoarthritis, right elbow: Secondary | ICD-10-CM | POA: Diagnosis not present

## 2022-03-13 DIAGNOSIS — M65331 Trigger finger, right middle finger: Secondary | ICD-10-CM | POA: Diagnosis not present

## 2022-03-14 DIAGNOSIS — M19021 Primary osteoarthritis, right elbow: Secondary | ICD-10-CM | POA: Diagnosis not present

## 2022-03-14 DIAGNOSIS — M65331 Trigger finger, right middle finger: Secondary | ICD-10-CM | POA: Diagnosis not present

## 2022-03-19 DIAGNOSIS — M19021 Primary osteoarthritis, right elbow: Secondary | ICD-10-CM | POA: Diagnosis not present

## 2022-03-19 DIAGNOSIS — M65331 Trigger finger, right middle finger: Secondary | ICD-10-CM | POA: Diagnosis not present

## 2022-03-25 DIAGNOSIS — M65331 Trigger finger, right middle finger: Secondary | ICD-10-CM | POA: Diagnosis not present

## 2022-03-25 DIAGNOSIS — M19021 Primary osteoarthritis, right elbow: Secondary | ICD-10-CM | POA: Diagnosis not present

## 2022-03-28 DIAGNOSIS — M19021 Primary osteoarthritis, right elbow: Secondary | ICD-10-CM | POA: Diagnosis not present

## 2022-03-28 DIAGNOSIS — M65331 Trigger finger, right middle finger: Secondary | ICD-10-CM | POA: Diagnosis not present

## 2022-04-07 DIAGNOSIS — M5386 Other specified dorsopathies, lumbar region: Secondary | ICD-10-CM | POA: Diagnosis not present

## 2022-04-07 DIAGNOSIS — M531 Cervicobrachial syndrome: Secondary | ICD-10-CM | POA: Diagnosis not present

## 2022-04-07 DIAGNOSIS — M9904 Segmental and somatic dysfunction of sacral region: Secondary | ICD-10-CM | POA: Diagnosis not present

## 2022-04-07 DIAGNOSIS — M9903 Segmental and somatic dysfunction of lumbar region: Secondary | ICD-10-CM | POA: Diagnosis not present

## 2022-04-10 DIAGNOSIS — M65331 Trigger finger, right middle finger: Secondary | ICD-10-CM | POA: Diagnosis not present

## 2022-04-10 DIAGNOSIS — M19021 Primary osteoarthritis, right elbow: Secondary | ICD-10-CM | POA: Diagnosis not present

## 2022-04-11 ENCOUNTER — Ambulatory Visit (INDEPENDENT_AMBULATORY_CARE_PROVIDER_SITE_OTHER): Payer: BC Managed Care – PPO | Admitting: Family Medicine

## 2022-04-11 ENCOUNTER — Encounter: Payer: Self-pay | Admitting: Family Medicine

## 2022-04-11 ENCOUNTER — Ambulatory Visit: Payer: BC Managed Care – PPO | Admitting: Family Medicine

## 2022-04-11 VITALS — BP 110/76 | HR 55 | Temp 97.7°F | Ht 70.25 in | Wt 201.2 lb

## 2022-04-11 DIAGNOSIS — Z8249 Family history of ischemic heart disease and other diseases of the circulatory system: Secondary | ICD-10-CM

## 2022-04-11 LAB — LIPID PANEL
Cholesterol: 161 mg/dL (ref 0–200)
HDL: 70.3 mg/dL (ref >39.00)
LDL Cholesterol: 79 mg/dL (ref 0–99)
NonHDL: 90.42
Total CHOL/HDL Ratio: 2
Triglycerides: 58 mg/dL (ref 0.0–149.0)
VLDL: 11.6 mg/dL (ref 0.0–40.0)

## 2022-04-11 LAB — BASIC METABOLIC PANEL
BUN: 18 mg/dL (ref 6–23)
CO2: 25 mEq/L (ref 19–32)
Calcium: 9.3 mg/dL (ref 8.4–10.5)
Chloride: 106 mEq/L (ref 96–112)
Creatinine, Ser: 0.98 mg/dL (ref 0.40–1.50)
GFR: 87.47 mL/min (ref >60.00)
Glucose, Bld: 91 mg/dL (ref 70–99)
Potassium: 4.1 mEq/L (ref 3.5–5.1)
Sodium: 139 mEq/L (ref 135–145)

## 2022-04-11 NOTE — Progress Notes (Signed)
   Established Patient Office Visit  Subjective   Patient ID: Justin Rivera, male    DOB: Nov 23, 1965  Age: 56 y.o. MRN: [id number]  Chief Complaint  Patient presents with   Labs Only    HPI   Justin Rivera is seen basically requesting labs to recheck lipids.  He has a form for work for his insurance that needs to be completed today.  Needs fasting lipid and glucose.  Does have past history of mild hyperlipidemia.  He had a brother with family history of premature CAD in his 30s.  He died of sudden MI.  Justin Rivera had coronary calcium score of 0 back in 2019.  He is very health-conscious.  Exercises regularly.  No chronic medical problems.  Non-smoker  History reviewed. No pertinent past medical history. History reviewed. No pertinent surgical history.  reports that he has never smoked. He uses smokeless tobacco. He reports current alcohol use. He reports that he does not use drugs. family history includes Heart attack in his maternal grandmother; Hypertension in his father. No Known Allergies  Review of Systems  Constitutional:  Negative for malaise/fatigue.  Eyes:  Negative for blurred vision.  Respiratory:  Negative for shortness of breath.   Cardiovascular:  Negative for chest pain.  Neurological:  Negative for dizziness, weakness and headaches.      Objective:     BP 110/76 (BP Location: Left Arm, Patient Position: Sitting, Cuff Size: Normal)   Pulse (!) 55   Temp 97.7 F (36.5 C) (Oral)   Ht 5' 10.25" (1.784 m)   Wt 201 lb 3.2 oz (91.3 kg)   SpO2 99%   BMI 28.66 kg/m    Physical Exam Vitals reviewed.  Constitutional:      Appearance: He is well-developed.  Eyes:     Pupils: Pupils are equal, round, and reactive to light.  Neck:     Thyroid: No thyromegaly.  Cardiovascular:     Rate and Rhythm: Normal rate and regular rhythm.  Pulmonary:     Effort: Pulmonary effort is normal. No respiratory distress.     Breath sounds: Normal breath sounds. No wheezing or rales.   Musculoskeletal:     Cervical back: Neck supple.  Neurological:     Mental Status: He is alert and oriented to person, place, and time.      No results found for any visits on 04/11/22.    The 10-year ASCVD risk score (Arnett DK, et al., 2019) is: 2.4%    Assessment & Plan:   Problem List Items Addressed This Visit   None Visit Diagnoses     Family history of early CAD    -  Primary   Relevant Orders   Lipid panel   Basic metabolic panel     He has history of mild hyperlipidemia and family history of premature CAD in her brother.  Previous coronary calcium score of 0.  Requesting labs.  Fasting lipid and basic metabolic panel obtained.  Continue regular exercise habits.  Handout for healthy diet given.  No follow-ups on file.    Carolann Littler, MD

## 2022-04-17 DIAGNOSIS — M65331 Trigger finger, right middle finger: Secondary | ICD-10-CM | POA: Diagnosis not present

## 2022-04-17 DIAGNOSIS — M19021 Primary osteoarthritis, right elbow: Secondary | ICD-10-CM | POA: Diagnosis not present

## 2022-04-21 DIAGNOSIS — M9903 Segmental and somatic dysfunction of lumbar region: Secondary | ICD-10-CM | POA: Diagnosis not present

## 2022-04-21 DIAGNOSIS — M531 Cervicobrachial syndrome: Secondary | ICD-10-CM | POA: Diagnosis not present

## 2022-04-21 DIAGNOSIS — M9904 Segmental and somatic dysfunction of sacral region: Secondary | ICD-10-CM | POA: Diagnosis not present

## 2022-04-21 DIAGNOSIS — M5386 Other specified dorsopathies, lumbar region: Secondary | ICD-10-CM | POA: Diagnosis not present

## 2022-05-02 DIAGNOSIS — M19021 Primary osteoarthritis, right elbow: Secondary | ICD-10-CM | POA: Diagnosis not present

## 2022-05-02 DIAGNOSIS — M65331 Trigger finger, right middle finger: Secondary | ICD-10-CM | POA: Diagnosis not present

## 2022-05-05 DIAGNOSIS — M5386 Other specified dorsopathies, lumbar region: Secondary | ICD-10-CM | POA: Diagnosis not present

## 2022-05-05 DIAGNOSIS — M531 Cervicobrachial syndrome: Secondary | ICD-10-CM | POA: Diagnosis not present

## 2022-05-05 DIAGNOSIS — M9904 Segmental and somatic dysfunction of sacral region: Secondary | ICD-10-CM | POA: Diagnosis not present

## 2022-05-05 DIAGNOSIS — M9903 Segmental and somatic dysfunction of lumbar region: Secondary | ICD-10-CM | POA: Diagnosis not present

## 2022-05-20 DIAGNOSIS — M9904 Segmental and somatic dysfunction of sacral region: Secondary | ICD-10-CM | POA: Diagnosis not present

## 2022-05-20 DIAGNOSIS — M9903 Segmental and somatic dysfunction of lumbar region: Secondary | ICD-10-CM | POA: Diagnosis not present

## 2022-05-20 DIAGNOSIS — M531 Cervicobrachial syndrome: Secondary | ICD-10-CM | POA: Diagnosis not present

## 2022-05-20 DIAGNOSIS — M5386 Other specified dorsopathies, lumbar region: Secondary | ICD-10-CM | POA: Diagnosis not present

## 2022-06-02 DIAGNOSIS — M5386 Other specified dorsopathies, lumbar region: Secondary | ICD-10-CM | POA: Diagnosis not present

## 2022-06-02 DIAGNOSIS — M9904 Segmental and somatic dysfunction of sacral region: Secondary | ICD-10-CM | POA: Diagnosis not present

## 2022-06-02 DIAGNOSIS — M531 Cervicobrachial syndrome: Secondary | ICD-10-CM | POA: Diagnosis not present

## 2022-06-02 DIAGNOSIS — M9903 Segmental and somatic dysfunction of lumbar region: Secondary | ICD-10-CM | POA: Diagnosis not present

## 2022-06-16 DIAGNOSIS — M531 Cervicobrachial syndrome: Secondary | ICD-10-CM | POA: Diagnosis not present

## 2022-06-16 DIAGNOSIS — M9903 Segmental and somatic dysfunction of lumbar region: Secondary | ICD-10-CM | POA: Diagnosis not present

## 2022-06-16 DIAGNOSIS — M9904 Segmental and somatic dysfunction of sacral region: Secondary | ICD-10-CM | POA: Diagnosis not present

## 2022-06-16 DIAGNOSIS — M5386 Other specified dorsopathies, lumbar region: Secondary | ICD-10-CM | POA: Diagnosis not present

## 2022-06-23 DIAGNOSIS — M531 Cervicobrachial syndrome: Secondary | ICD-10-CM | POA: Diagnosis not present

## 2022-06-23 DIAGNOSIS — M9903 Segmental and somatic dysfunction of lumbar region: Secondary | ICD-10-CM | POA: Diagnosis not present

## 2022-06-23 DIAGNOSIS — M5386 Other specified dorsopathies, lumbar region: Secondary | ICD-10-CM | POA: Diagnosis not present

## 2022-06-23 DIAGNOSIS — M9904 Segmental and somatic dysfunction of sacral region: Secondary | ICD-10-CM | POA: Diagnosis not present

## 2022-07-15 DIAGNOSIS — M9904 Segmental and somatic dysfunction of sacral region: Secondary | ICD-10-CM | POA: Diagnosis not present

## 2022-07-15 DIAGNOSIS — M5386 Other specified dorsopathies, lumbar region: Secondary | ICD-10-CM | POA: Diagnosis not present

## 2022-07-15 DIAGNOSIS — M9903 Segmental and somatic dysfunction of lumbar region: Secondary | ICD-10-CM | POA: Diagnosis not present

## 2022-07-15 DIAGNOSIS — M531 Cervicobrachial syndrome: Secondary | ICD-10-CM | POA: Diagnosis not present

## 2022-07-21 DIAGNOSIS — M9903 Segmental and somatic dysfunction of lumbar region: Secondary | ICD-10-CM | POA: Diagnosis not present

## 2022-07-21 DIAGNOSIS — M9904 Segmental and somatic dysfunction of sacral region: Secondary | ICD-10-CM | POA: Diagnosis not present

## 2022-07-21 DIAGNOSIS — M531 Cervicobrachial syndrome: Secondary | ICD-10-CM | POA: Diagnosis not present

## 2022-07-21 DIAGNOSIS — M5386 Other specified dorsopathies, lumbar region: Secondary | ICD-10-CM | POA: Diagnosis not present

## 2022-07-28 DIAGNOSIS — M531 Cervicobrachial syndrome: Secondary | ICD-10-CM | POA: Diagnosis not present

## 2022-07-28 DIAGNOSIS — M9904 Segmental and somatic dysfunction of sacral region: Secondary | ICD-10-CM | POA: Diagnosis not present

## 2022-07-28 DIAGNOSIS — M9903 Segmental and somatic dysfunction of lumbar region: Secondary | ICD-10-CM | POA: Diagnosis not present

## 2022-07-28 DIAGNOSIS — M5386 Other specified dorsopathies, lumbar region: Secondary | ICD-10-CM | POA: Diagnosis not present

## 2022-08-11 DIAGNOSIS — M9904 Segmental and somatic dysfunction of sacral region: Secondary | ICD-10-CM | POA: Diagnosis not present

## 2022-08-11 DIAGNOSIS — M5386 Other specified dorsopathies, lumbar region: Secondary | ICD-10-CM | POA: Diagnosis not present

## 2022-08-11 DIAGNOSIS — M9903 Segmental and somatic dysfunction of lumbar region: Secondary | ICD-10-CM | POA: Diagnosis not present

## 2022-08-11 DIAGNOSIS — M531 Cervicobrachial syndrome: Secondary | ICD-10-CM | POA: Diagnosis not present

## 2022-08-25 DIAGNOSIS — M531 Cervicobrachial syndrome: Secondary | ICD-10-CM | POA: Diagnosis not present

## 2022-08-25 DIAGNOSIS — M9904 Segmental and somatic dysfunction of sacral region: Secondary | ICD-10-CM | POA: Diagnosis not present

## 2022-08-25 DIAGNOSIS — M5386 Other specified dorsopathies, lumbar region: Secondary | ICD-10-CM | POA: Diagnosis not present

## 2022-08-25 DIAGNOSIS — M9903 Segmental and somatic dysfunction of lumbar region: Secondary | ICD-10-CM | POA: Diagnosis not present

## 2022-09-08 DIAGNOSIS — M9903 Segmental and somatic dysfunction of lumbar region: Secondary | ICD-10-CM | POA: Diagnosis not present

## 2022-09-08 DIAGNOSIS — M531 Cervicobrachial syndrome: Secondary | ICD-10-CM | POA: Diagnosis not present

## 2022-09-08 DIAGNOSIS — M9904 Segmental and somatic dysfunction of sacral region: Secondary | ICD-10-CM | POA: Diagnosis not present

## 2022-09-08 DIAGNOSIS — M5386 Other specified dorsopathies, lumbar region: Secondary | ICD-10-CM | POA: Diagnosis not present

## 2022-11-07 DIAGNOSIS — C441922 Other specified malignant neoplasm of skin of right lower eyelid, including canthus: Secondary | ICD-10-CM | POA: Diagnosis not present

## 2022-11-24 DIAGNOSIS — M9904 Segmental and somatic dysfunction of sacral region: Secondary | ICD-10-CM | POA: Diagnosis not present

## 2022-11-24 DIAGNOSIS — M531 Cervicobrachial syndrome: Secondary | ICD-10-CM | POA: Diagnosis not present

## 2022-11-24 DIAGNOSIS — M5386 Other specified dorsopathies, lumbar region: Secondary | ICD-10-CM | POA: Diagnosis not present

## 2022-11-24 DIAGNOSIS — M9903 Segmental and somatic dysfunction of lumbar region: Secondary | ICD-10-CM | POA: Diagnosis not present

## 2022-12-01 DIAGNOSIS — M9903 Segmental and somatic dysfunction of lumbar region: Secondary | ICD-10-CM | POA: Diagnosis not present

## 2022-12-01 DIAGNOSIS — M9904 Segmental and somatic dysfunction of sacral region: Secondary | ICD-10-CM | POA: Diagnosis not present

## 2022-12-01 DIAGNOSIS — M5386 Other specified dorsopathies, lumbar region: Secondary | ICD-10-CM | POA: Diagnosis not present

## 2022-12-01 DIAGNOSIS — M531 Cervicobrachial syndrome: Secondary | ICD-10-CM | POA: Diagnosis not present

## 2022-12-15 DIAGNOSIS — M9904 Segmental and somatic dysfunction of sacral region: Secondary | ICD-10-CM | POA: Diagnosis not present

## 2022-12-15 DIAGNOSIS — M531 Cervicobrachial syndrome: Secondary | ICD-10-CM | POA: Diagnosis not present

## 2022-12-15 DIAGNOSIS — M9903 Segmental and somatic dysfunction of lumbar region: Secondary | ICD-10-CM | POA: Diagnosis not present

## 2022-12-15 DIAGNOSIS — M5386 Other specified dorsopathies, lumbar region: Secondary | ICD-10-CM | POA: Diagnosis not present

## 2022-12-19 DIAGNOSIS — E559 Vitamin D deficiency, unspecified: Secondary | ICD-10-CM | POA: Diagnosis not present

## 2022-12-19 DIAGNOSIS — R768 Other specified abnormal immunological findings in serum: Secondary | ICD-10-CM | POA: Diagnosis not present

## 2022-12-19 DIAGNOSIS — E291 Testicular hypofunction: Secondary | ICD-10-CM | POA: Diagnosis not present

## 2022-12-23 DIAGNOSIS — M531 Cervicobrachial syndrome: Secondary | ICD-10-CM | POA: Diagnosis not present

## 2022-12-23 DIAGNOSIS — M9904 Segmental and somatic dysfunction of sacral region: Secondary | ICD-10-CM | POA: Diagnosis not present

## 2022-12-23 DIAGNOSIS — M9903 Segmental and somatic dysfunction of lumbar region: Secondary | ICD-10-CM | POA: Diagnosis not present

## 2022-12-23 DIAGNOSIS — M5386 Other specified dorsopathies, lumbar region: Secondary | ICD-10-CM | POA: Diagnosis not present

## 2022-12-30 DIAGNOSIS — M5386 Other specified dorsopathies, lumbar region: Secondary | ICD-10-CM | POA: Diagnosis not present

## 2022-12-30 DIAGNOSIS — M9904 Segmental and somatic dysfunction of sacral region: Secondary | ICD-10-CM | POA: Diagnosis not present

## 2022-12-30 DIAGNOSIS — M9903 Segmental and somatic dysfunction of lumbar region: Secondary | ICD-10-CM | POA: Diagnosis not present

## 2022-12-30 DIAGNOSIS — M531 Cervicobrachial syndrome: Secondary | ICD-10-CM | POA: Diagnosis not present

## 2023-01-06 DIAGNOSIS — M9904 Segmental and somatic dysfunction of sacral region: Secondary | ICD-10-CM | POA: Diagnosis not present

## 2023-01-06 DIAGNOSIS — M9903 Segmental and somatic dysfunction of lumbar region: Secondary | ICD-10-CM | POA: Diagnosis not present

## 2023-01-06 DIAGNOSIS — M531 Cervicobrachial syndrome: Secondary | ICD-10-CM | POA: Diagnosis not present

## 2023-01-06 DIAGNOSIS — M5386 Other specified dorsopathies, lumbar region: Secondary | ICD-10-CM | POA: Diagnosis not present

## 2023-01-20 DIAGNOSIS — M9903 Segmental and somatic dysfunction of lumbar region: Secondary | ICD-10-CM | POA: Diagnosis not present

## 2023-01-20 DIAGNOSIS — M531 Cervicobrachial syndrome: Secondary | ICD-10-CM | POA: Diagnosis not present

## 2023-01-20 DIAGNOSIS — M5386 Other specified dorsopathies, lumbar region: Secondary | ICD-10-CM | POA: Diagnosis not present

## 2023-01-20 DIAGNOSIS — M9904 Segmental and somatic dysfunction of sacral region: Secondary | ICD-10-CM | POA: Diagnosis not present

## 2023-01-22 DIAGNOSIS — Z6827 Body mass index (BMI) 27.0-27.9, adult: Secondary | ICD-10-CM | POA: Diagnosis not present

## 2023-01-22 DIAGNOSIS — E291 Testicular hypofunction: Secondary | ICD-10-CM | POA: Diagnosis not present

## 2023-02-02 DIAGNOSIS — M9903 Segmental and somatic dysfunction of lumbar region: Secondary | ICD-10-CM | POA: Diagnosis not present

## 2023-02-02 DIAGNOSIS — M531 Cervicobrachial syndrome: Secondary | ICD-10-CM | POA: Diagnosis not present

## 2023-02-02 DIAGNOSIS — M5386 Other specified dorsopathies, lumbar region: Secondary | ICD-10-CM | POA: Diagnosis not present

## 2023-02-02 DIAGNOSIS — M9904 Segmental and somatic dysfunction of sacral region: Secondary | ICD-10-CM | POA: Diagnosis not present

## 2023-02-09 DIAGNOSIS — M9903 Segmental and somatic dysfunction of lumbar region: Secondary | ICD-10-CM | POA: Diagnosis not present

## 2023-02-09 DIAGNOSIS — M5386 Other specified dorsopathies, lumbar region: Secondary | ICD-10-CM | POA: Diagnosis not present

## 2023-02-09 DIAGNOSIS — M531 Cervicobrachial syndrome: Secondary | ICD-10-CM | POA: Diagnosis not present

## 2023-02-09 DIAGNOSIS — M9904 Segmental and somatic dysfunction of sacral region: Secondary | ICD-10-CM | POA: Diagnosis not present

## 2023-02-16 DIAGNOSIS — M9903 Segmental and somatic dysfunction of lumbar region: Secondary | ICD-10-CM | POA: Diagnosis not present

## 2023-02-16 DIAGNOSIS — M531 Cervicobrachial syndrome: Secondary | ICD-10-CM | POA: Diagnosis not present

## 2023-02-16 DIAGNOSIS — M5386 Other specified dorsopathies, lumbar region: Secondary | ICD-10-CM | POA: Diagnosis not present

## 2023-02-16 DIAGNOSIS — M9904 Segmental and somatic dysfunction of sacral region: Secondary | ICD-10-CM | POA: Diagnosis not present

## 2023-02-19 DIAGNOSIS — E039 Hypothyroidism, unspecified: Secondary | ICD-10-CM | POA: Diagnosis not present

## 2023-02-19 DIAGNOSIS — Z79890 Hormone replacement therapy (postmenopausal): Secondary | ICD-10-CM | POA: Diagnosis not present

## 2023-02-19 DIAGNOSIS — E291 Testicular hypofunction: Secondary | ICD-10-CM | POA: Diagnosis not present

## 2023-02-23 DIAGNOSIS — M9904 Segmental and somatic dysfunction of sacral region: Secondary | ICD-10-CM | POA: Diagnosis not present

## 2023-02-23 DIAGNOSIS — M5386 Other specified dorsopathies, lumbar region: Secondary | ICD-10-CM | POA: Diagnosis not present

## 2023-02-23 DIAGNOSIS — M9903 Segmental and somatic dysfunction of lumbar region: Secondary | ICD-10-CM | POA: Diagnosis not present

## 2023-02-23 DIAGNOSIS — M531 Cervicobrachial syndrome: Secondary | ICD-10-CM | POA: Diagnosis not present

## 2023-03-03 DIAGNOSIS — M5386 Other specified dorsopathies, lumbar region: Secondary | ICD-10-CM | POA: Diagnosis not present

## 2023-03-03 DIAGNOSIS — M531 Cervicobrachial syndrome: Secondary | ICD-10-CM | POA: Diagnosis not present

## 2023-03-03 DIAGNOSIS — M9903 Segmental and somatic dysfunction of lumbar region: Secondary | ICD-10-CM | POA: Diagnosis not present

## 2023-03-03 DIAGNOSIS — M9904 Segmental and somatic dysfunction of sacral region: Secondary | ICD-10-CM | POA: Diagnosis not present

## 2023-03-06 DIAGNOSIS — R5382 Chronic fatigue, unspecified: Secondary | ICD-10-CM | POA: Diagnosis not present

## 2023-03-06 DIAGNOSIS — E291 Testicular hypofunction: Secondary | ICD-10-CM | POA: Diagnosis not present

## 2023-03-06 DIAGNOSIS — M2550 Pain in unspecified joint: Secondary | ICD-10-CM | POA: Diagnosis not present

## 2023-03-11 DIAGNOSIS — M9903 Segmental and somatic dysfunction of lumbar region: Secondary | ICD-10-CM | POA: Diagnosis not present

## 2023-03-11 DIAGNOSIS — M9904 Segmental and somatic dysfunction of sacral region: Secondary | ICD-10-CM | POA: Diagnosis not present

## 2023-03-11 DIAGNOSIS — M5386 Other specified dorsopathies, lumbar region: Secondary | ICD-10-CM | POA: Diagnosis not present

## 2023-03-11 DIAGNOSIS — M531 Cervicobrachial syndrome: Secondary | ICD-10-CM | POA: Diagnosis not present

## 2023-03-18 DIAGNOSIS — M5386 Other specified dorsopathies, lumbar region: Secondary | ICD-10-CM | POA: Diagnosis not present

## 2023-03-18 DIAGNOSIS — M9904 Segmental and somatic dysfunction of sacral region: Secondary | ICD-10-CM | POA: Diagnosis not present

## 2023-03-18 DIAGNOSIS — M9903 Segmental and somatic dysfunction of lumbar region: Secondary | ICD-10-CM | POA: Diagnosis not present

## 2023-03-18 DIAGNOSIS — M531 Cervicobrachial syndrome: Secondary | ICD-10-CM | POA: Diagnosis not present

## 2023-03-25 DIAGNOSIS — M531 Cervicobrachial syndrome: Secondary | ICD-10-CM | POA: Diagnosis not present

## 2023-03-25 DIAGNOSIS — M9904 Segmental and somatic dysfunction of sacral region: Secondary | ICD-10-CM | POA: Diagnosis not present

## 2023-03-25 DIAGNOSIS — M9903 Segmental and somatic dysfunction of lumbar region: Secondary | ICD-10-CM | POA: Diagnosis not present

## 2023-03-25 DIAGNOSIS — M5386 Other specified dorsopathies, lumbar region: Secondary | ICD-10-CM | POA: Diagnosis not present

## 2023-03-31 DIAGNOSIS — M531 Cervicobrachial syndrome: Secondary | ICD-10-CM | POA: Diagnosis not present

## 2023-03-31 DIAGNOSIS — M5386 Other specified dorsopathies, lumbar region: Secondary | ICD-10-CM | POA: Diagnosis not present

## 2023-03-31 DIAGNOSIS — M9903 Segmental and somatic dysfunction of lumbar region: Secondary | ICD-10-CM | POA: Diagnosis not present

## 2023-03-31 DIAGNOSIS — M9904 Segmental and somatic dysfunction of sacral region: Secondary | ICD-10-CM | POA: Diagnosis not present

## 2023-04-14 DIAGNOSIS — M531 Cervicobrachial syndrome: Secondary | ICD-10-CM | POA: Diagnosis not present

## 2023-04-14 DIAGNOSIS — M9904 Segmental and somatic dysfunction of sacral region: Secondary | ICD-10-CM | POA: Diagnosis not present

## 2023-04-14 DIAGNOSIS — M9903 Segmental and somatic dysfunction of lumbar region: Secondary | ICD-10-CM | POA: Diagnosis not present

## 2023-04-14 DIAGNOSIS — M5386 Other specified dorsopathies, lumbar region: Secondary | ICD-10-CM | POA: Diagnosis not present

## 2023-04-22 DIAGNOSIS — M5386 Other specified dorsopathies, lumbar region: Secondary | ICD-10-CM | POA: Diagnosis not present

## 2023-04-22 DIAGNOSIS — M9903 Segmental and somatic dysfunction of lumbar region: Secondary | ICD-10-CM | POA: Diagnosis not present

## 2023-04-22 DIAGNOSIS — M9904 Segmental and somatic dysfunction of sacral region: Secondary | ICD-10-CM | POA: Diagnosis not present

## 2023-04-22 DIAGNOSIS — M531 Cervicobrachial syndrome: Secondary | ICD-10-CM | POA: Diagnosis not present

## 2023-04-29 DIAGNOSIS — M531 Cervicobrachial syndrome: Secondary | ICD-10-CM | POA: Diagnosis not present

## 2023-04-29 DIAGNOSIS — M5386 Other specified dorsopathies, lumbar region: Secondary | ICD-10-CM | POA: Diagnosis not present

## 2023-04-29 DIAGNOSIS — M9904 Segmental and somatic dysfunction of sacral region: Secondary | ICD-10-CM | POA: Diagnosis not present

## 2023-04-29 DIAGNOSIS — M9903 Segmental and somatic dysfunction of lumbar region: Secondary | ICD-10-CM | POA: Diagnosis not present

## 2023-05-06 DIAGNOSIS — M5386 Other specified dorsopathies, lumbar region: Secondary | ICD-10-CM | POA: Diagnosis not present

## 2023-05-06 DIAGNOSIS — M9904 Segmental and somatic dysfunction of sacral region: Secondary | ICD-10-CM | POA: Diagnosis not present

## 2023-05-06 DIAGNOSIS — M531 Cervicobrachial syndrome: Secondary | ICD-10-CM | POA: Diagnosis not present

## 2023-05-06 DIAGNOSIS — M9903 Segmental and somatic dysfunction of lumbar region: Secondary | ICD-10-CM | POA: Diagnosis not present

## 2023-05-19 DIAGNOSIS — M9903 Segmental and somatic dysfunction of lumbar region: Secondary | ICD-10-CM | POA: Diagnosis not present

## 2023-05-19 DIAGNOSIS — M9904 Segmental and somatic dysfunction of sacral region: Secondary | ICD-10-CM | POA: Diagnosis not present

## 2023-05-19 DIAGNOSIS — M5386 Other specified dorsopathies, lumbar region: Secondary | ICD-10-CM | POA: Diagnosis not present

## 2023-05-19 DIAGNOSIS — M531 Cervicobrachial syndrome: Secondary | ICD-10-CM | POA: Diagnosis not present

## 2023-05-27 DIAGNOSIS — M9904 Segmental and somatic dysfunction of sacral region: Secondary | ICD-10-CM | POA: Diagnosis not present

## 2023-05-27 DIAGNOSIS — M5386 Other specified dorsopathies, lumbar region: Secondary | ICD-10-CM | POA: Diagnosis not present

## 2023-05-27 DIAGNOSIS — M9903 Segmental and somatic dysfunction of lumbar region: Secondary | ICD-10-CM | POA: Diagnosis not present

## 2023-05-27 DIAGNOSIS — M531 Cervicobrachial syndrome: Secondary | ICD-10-CM | POA: Diagnosis not present

## 2023-06-01 DIAGNOSIS — M9903 Segmental and somatic dysfunction of lumbar region: Secondary | ICD-10-CM | POA: Diagnosis not present

## 2023-06-01 DIAGNOSIS — M9904 Segmental and somatic dysfunction of sacral region: Secondary | ICD-10-CM | POA: Diagnosis not present

## 2023-06-01 DIAGNOSIS — M531 Cervicobrachial syndrome: Secondary | ICD-10-CM | POA: Diagnosis not present

## 2023-06-01 DIAGNOSIS — M5386 Other specified dorsopathies, lumbar region: Secondary | ICD-10-CM | POA: Diagnosis not present

## 2023-06-04 DIAGNOSIS — E291 Testicular hypofunction: Secondary | ICD-10-CM | POA: Diagnosis not present

## 2023-06-04 DIAGNOSIS — Z79890 Hormone replacement therapy (postmenopausal): Secondary | ICD-10-CM | POA: Diagnosis not present

## 2023-06-04 DIAGNOSIS — E039 Hypothyroidism, unspecified: Secondary | ICD-10-CM | POA: Diagnosis not present

## 2023-06-09 DIAGNOSIS — F419 Anxiety disorder, unspecified: Secondary | ICD-10-CM | POA: Diagnosis not present

## 2023-06-09 DIAGNOSIS — E291 Testicular hypofunction: Secondary | ICD-10-CM | POA: Diagnosis not present

## 2023-06-09 DIAGNOSIS — M2550 Pain in unspecified joint: Secondary | ICD-10-CM | POA: Diagnosis not present

## 2023-06-09 DIAGNOSIS — Z6825 Body mass index (BMI) 25.0-25.9, adult: Secondary | ICD-10-CM | POA: Diagnosis not present

## 2023-06-16 DIAGNOSIS — M9904 Segmental and somatic dysfunction of sacral region: Secondary | ICD-10-CM | POA: Diagnosis not present

## 2023-06-16 DIAGNOSIS — M531 Cervicobrachial syndrome: Secondary | ICD-10-CM | POA: Diagnosis not present

## 2023-06-16 DIAGNOSIS — M5386 Other specified dorsopathies, lumbar region: Secondary | ICD-10-CM | POA: Diagnosis not present

## 2023-06-16 DIAGNOSIS — M9903 Segmental and somatic dysfunction of lumbar region: Secondary | ICD-10-CM | POA: Diagnosis not present

## 2023-07-06 DIAGNOSIS — M79644 Pain in right finger(s): Secondary | ICD-10-CM | POA: Diagnosis not present

## 2023-07-06 DIAGNOSIS — M79645 Pain in left finger(s): Secondary | ICD-10-CM | POA: Diagnosis not present

## 2023-07-06 DIAGNOSIS — M79641 Pain in right hand: Secondary | ICD-10-CM | POA: Diagnosis not present

## 2023-07-23 IMAGING — CT CT 3D INDEPENDENT WKST
1 series · 1 of 5 positions shown · non-contrast
Comparison: Right elbow radiographs 01/18/2021

CLINICAL DATA: Limited range of motion, off and on catching. No
history of surgery.

EXAM:
CT OF THE UPPER RIGHT EXTREMITY WITHOUT CONTRAST
TECHNIQUE: Multidetector CT imaging of the upper right extremity was performed
according to the standard protocol.

[Series 4005: flip · 0.66mm/px · 1 of 5 slices shown]
[im 3/5]
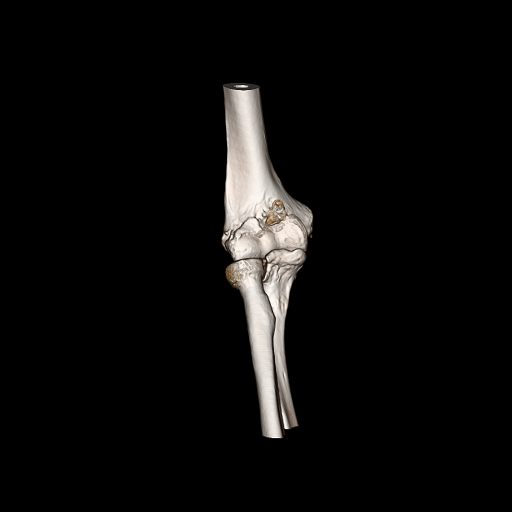

[1 of 5 positions shown; findings below may reference images not displayed]

RADIATION DOSE REDUCTION: This exam was performed according to the
departmental dose-optimization program which includes automated
exposure control, adjustment of the mA and/or kV according to
patient size and/or use of iterative reconstruction technique.

3D surface rendered models were also reconstructed on a separate
workstation and reviewed.
FINDINGS: Bones/Joint/Cartilage

Severe radiocarpal joint space narrowing, greatest posteriorly where
there is bone-on-bone contact (coronal series 7, image 27). Moderate
peripheral radial head-neck junction degenerative osteophytosis.
Small well corticated chronic ossicle measuring up to approximately
6 mm just distal to the lateral radial head-neck junction (series 7,
image 35 and series 4, image 47). Mild-to-moderate peripheral
lateral capitellar degenerative osteophytosis.

Moderate to high-grade peripheral medial aspect of the trochlea and
olecranon degenerative spurring with only mild joint space narrowing
of the far medial aspect.

Moderate spurring at the tip of the coronoid process.

Mild chronic spurring at the distal humeral lateral epicondyle.

6 mm loose body within the posterior elbow at the junction of the
radial head and proximal lateral olecranon (series 7, image 20 and
series 4, image 51).

16 mm loose body just posterior to the distal humeral metaphysis
(series 7, image 18 and series 4, image 69)in the region of the
olecranon fossa.

No acute fracture is seen.

Ligaments

Suboptimally assessed by CT.

Muscles and Tendons

Normal size and density of the regional musculature.

Soft tissues

Small elbow joint effusion seen deep to the common extensor tendon
origin (coronal series 8 images 28 through 32).

--

Review of the 3D surface rendered models confirms the severe
osteoarthritis.
IMPRESSION: :
IMPRESSION: 1. Severe diffuse elbow osteoarthritis, as above.
2. No acute fracture is seen.
3. 16 mm loose body within the posterior elbow at the olecranon
fossa and 6 mm loose body within the more distal posterior elbow at
the junction of the radial head and proximal lateral olecranon.
4. Small elbow joint effusion that may extend into the common
extensor tendon origin. There may be a partial-thickness tear of the
deep aspect of the common extensor tendon origin.

## 2023-09-18 ENCOUNTER — Ambulatory Visit: Payer: BC Managed Care – PPO | Admitting: Family Medicine

## 2023-09-18 ENCOUNTER — Encounter: Payer: Self-pay | Admitting: Family Medicine

## 2023-09-18 VITALS — BP 112/70 | HR 44 | Temp 97.7°F | Ht 70.87 in | Wt 182.1 lb

## 2023-09-18 DIAGNOSIS — Z0000 Encounter for general adult medical examination without abnormal findings: Secondary | ICD-10-CM

## 2023-09-18 LAB — CBC WITH DIFFERENTIAL/PLATELET
Basophils Absolute: 0 10*3/uL (ref 0.0–0.1)
Basophils Relative: 0.5 % (ref 0.0–3.0)
Eosinophils Absolute: 0.2 10*3/uL (ref 0.0–0.7)
Eosinophils Relative: 3.3 % (ref 0.0–5.0)
HCT: 44.9 % (ref 39.0–52.0)
Hemoglobin: 15 g/dL (ref 13.0–17.0)
Lymphocytes Relative: 35.6 % (ref 12.0–46.0)
Lymphs Abs: 2.2 10*3/uL (ref 0.7–4.0)
MCHC: 33.3 g/dL (ref 30.0–36.0)
MCV: 91.3 fl (ref 78.0–100.0)
Monocytes Absolute: 0.7 10*3/uL (ref 0.1–1.0)
Monocytes Relative: 12.3 % — ABNORMAL HIGH (ref 3.0–12.0)
Neutro Abs: 2.9 10*3/uL (ref 1.4–7.7)
Neutrophils Relative %: 48.3 % (ref 43.0–77.0)
Platelets: 296 10*3/uL (ref 150.0–400.0)
RBC: 4.92 Mil/uL (ref 4.22–5.81)
RDW: 12.9 % (ref 11.5–15.5)
WBC: 6.1 10*3/uL (ref 4.0–10.5)

## 2023-09-18 LAB — LIPID PANEL
Cholesterol: 176 mg/dL (ref 0–200)
HDL: 74.6 mg/dL (ref >39.00)
LDL Cholesterol: 89 mg/dL (ref 0–99)
NonHDL: 101.43
Total CHOL/HDL Ratio: 2
Triglycerides: 60 mg/dL (ref 0.0–149.0)
VLDL: 12 mg/dL (ref 0.0–40.0)

## 2023-09-18 LAB — BASIC METABOLIC PANEL
BUN: 21 mg/dL (ref 6–23)
CO2: 27 meq/L (ref 19–32)
Calcium: 9.8 mg/dL (ref 8.4–10.5)
Chloride: 103 meq/L (ref 96–112)
Creatinine, Ser: 0.98 mg/dL (ref 0.40–1.50)
GFR: 86.59 mL/min (ref >60.00)
Glucose, Bld: 90 mg/dL (ref 70–99)
Potassium: 4.6 meq/L (ref 3.5–5.1)
Sodium: 141 meq/L (ref 135–145)

## 2023-09-18 LAB — HEPATIC FUNCTION PANEL
ALT: 31 U/L (ref 0–53)
AST: 33 U/L (ref 0–37)
Albumin: 4.5 g/dL (ref 3.5–5.2)
Alkaline Phosphatase: 61 U/L (ref 39–117)
Bilirubin, Direct: 0.1 mg/dL (ref 0.0–0.3)
Total Bilirubin: 0.5 mg/dL (ref 0.2–1.2)
Total Protein: 7.1 g/dL (ref 6.0–8.3)

## 2023-09-18 LAB — PSA: PSA: 1.04 ng/mL (ref 0.10–4.00)

## 2023-09-18 NOTE — Patient Instructions (Signed)
 Consider Shingrix and Prevnar 20 vaccine.

## 2023-09-18 NOTE — Progress Notes (Signed)
 Established Patient Office Visit  Subjective   Patient ID: Justin Rivera, male    DOB: 1966/02/27  Age: 58 y.o. MRN: [id number]  Chief Complaint  Patient presents with   Annual Exam    HPI   Justin Rivera is seen for physical exam.  Generally very healthy.  He made some dietary changes over the past year and changed his workout regimen and lost about 20 pounds and feels very good overall.  Scaled back processed foods predominantly.  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  He has a brother who had history of CAD but Justin Rivera had coronary calcium score back in 2019 of 0  Health maintenance reviewed:  Health Maintenance  Topic Date Due   HIV Screening  Never done   Zoster Vaccines- Shingrix (1 of 2) Never done   INFLUENZA VACCINE  02/12/2023   COVID-19 Vaccine (3 - 2024-25 season) 03/15/2023   DTaP/Tdap/Td (2 - Td or Tdap) 07/25/2026   Colonoscopy  03/02/2030   Hepatitis C Screening  Completed   HPV VACCINES  Aged Out   -No history of pneumonia vaccine or Shingrix.  He declines at this time but will consider  Family History  Problem Relation Age of Onset   Hypertension Father    Heart attack Maternal Grandmother    Social History   Socioeconomic History   Marital status: Married    Spouse name: Not on file   Number of children: Not on file   Years of education: Not on file   Highest education level: Not on file  Occupational History   Not on file  Tobacco Use   Smoking status: Never   Smokeless tobacco: Current  Substance and Sexual Activity   Alcohol use: Yes   Drug use: No   Sexual activity: Not on file  Other Topics Concern   Not on file  Social History Narrative   Not on file   Social Drivers of Health   Financial Resource Strain: Not on file  Food Insecurity: Not on file  Transportation Needs: Not on file  Physical Activity: Not on file  Stress: Not on file  Social Connections: Not on file  Intimate Partner Violence: Not on file    Review of  Systems  Constitutional:  Negative for chills, fever, malaise/fatigue and weight loss.  HENT:  Negative for hearing loss.   Eyes:  Negative for blurred vision and double vision.  Respiratory:  Negative for cough and shortness of breath.   Cardiovascular:  Negative for chest pain, palpitations and leg swelling.  Gastrointestinal:  Negative for abdominal pain, blood in stool, constipation and diarrhea.  Genitourinary:  Negative for dysuria.  Skin:  Negative for rash.  Neurological:  Negative for dizziness, speech change, seizures, loss of consciousness and headaches.  Psychiatric/Behavioral:  Negative for depression.       Objective:     BP 112/70 (BP Location: Left Arm, Patient Position: Sitting, Cuff Size: Normal)   Pulse (!) 44   Temp 97.7 F (36.5 C) (Oral)   Ht 5' 10.87" (1.8 m)   Wt 182 lb 1.6 oz (82.6 kg)   SpO2 95%   BMI 25.49 kg/m  BP Readings from Last 3 Encounters:  09/18/23 112/70  04/11/22 110/76  02/03/20 120/70   Wt Readings from Last 3 Encounters:  09/18/23 182 lb 1.6 oz (82.6 kg)  04/11/22 201 lb 3.2 oz (91.3 kg)  02/14/22 194 lb (88 kg)      Physical Exam Vitals reviewed.  Constitutional:      General: He is not in acute distress.    Appearance: He is well-developed. He is not ill-appearing.  HENT:     Head: Normocephalic and atraumatic.     Right Ear: External ear normal.     Left Ear: External ear normal.  Eyes:     Conjunctiva/sclera: Conjunctivae normal.     Pupils: Pupils are equal, round, and reactive to light.  Neck:     Thyroid: No thyromegaly.  Cardiovascular:     Rate and Rhythm: Normal rate and regular rhythm.     Heart sounds: Normal heart sounds. No murmur heard. Pulmonary:     Effort: No respiratory distress.     Breath sounds: No wheezing or rales.  Abdominal:     General: Bowel sounds are normal. There is no distension.     Palpations: Abdomen is soft. There is no mass.     Tenderness: There is no abdominal tenderness. There  is no guarding or rebound.  Musculoskeletal:     Cervical back: Normal range of motion and neck supple.  Lymphadenopathy:     Cervical: No cervical adenopathy.  Skin:    Findings: No rash.  Neurological:     Mental Status: He is alert and oriented to person, place, and time.     Cranial Nerves: No cranial nerve deficit.      No results found for any visits on 09/18/23.    The 10-year ASCVD risk score (Arnett DK, et al., 2019) is: 2.6%    Assessment & Plan:   Problem List Items Addressed This Visit   None Visit Diagnoses       Physical exam    -  Primary   Relevant Orders   Basic metabolic panel   Lipid panel   CBC with Differential/Platelet   Hepatic function panel   PSA     Healthy 58 year old male.  No chronic medical problems.  Very health-conscious.  Coronary calcium score of 0 in 2019.  We discussed the following health maintenance items  -Obtain screening labs as above -Consider annual flu vaccine -Consider Prevnar 20 and Shingrix.  He will consider. -Continue regular exercise habits -Colonoscopy due 2031 -Tetanus due 2028  No follow-ups on file.    Evelena Peat, MD
